# Patient Record
Sex: Female | Born: 1980 | Race: White | Hispanic: No | Marital: Married | Smoking: Never smoker
Health system: Southern US, Community
[De-identification: ages and names within clinical notes are randomized; demographics above are authoritative.]

## PROBLEM LIST (undated history)

## (undated) ENCOUNTER — Inpatient Hospital Stay (HOSPITAL_COMMUNITY): Payer: Self-pay

## (undated) ENCOUNTER — Emergency Department (HOSPITAL_COMMUNITY)

## (undated) DIAGNOSIS — R51 Headache: Secondary | ICD-10-CM

## (undated) DIAGNOSIS — R519 Headache, unspecified: Secondary | ICD-10-CM

## (undated) DIAGNOSIS — K429 Umbilical hernia without obstruction or gangrene: Secondary | ICD-10-CM

## (undated) DIAGNOSIS — F32A Depression, unspecified: Secondary | ICD-10-CM

## (undated) DIAGNOSIS — F329 Major depressive disorder, single episode, unspecified: Secondary | ICD-10-CM

## (undated) DIAGNOSIS — D62 Acute posthemorrhagic anemia: Secondary | ICD-10-CM

## (undated) HISTORY — PX: WISDOM TOOTH EXTRACTION: SHX21

## (undated) HISTORY — PX: MOUTH SURGERY: SHX715

## (undated) HISTORY — PX: ABDOMINAL HYSTERECTOMY: SHX81

## (undated) HISTORY — PX: TONSILLECTOMY: SUR1361

## (undated) SURGERY — Surgical Case
Anesthesia: *Unknown

---

## 2010-09-29 LAB — RPR: RPR: NONREACTIVE

## 2010-09-29 LAB — HEPATITIS B SURFACE ANTIGEN: Hepatitis B Surface Ag: NEGATIVE

## 2010-09-29 LAB — ABO/RH

## 2010-09-29 LAB — RUBELLA ANTIBODY, IGM: Rubella: IMMUNE

## 2011-04-15 ENCOUNTER — Other Ambulatory Visit: Payer: Self-pay | Admitting: Obstetrics and Gynecology

## 2011-04-15 ENCOUNTER — Encounter (HOSPITAL_COMMUNITY): Payer: Self-pay | Admitting: Pharmacist

## 2011-04-28 ENCOUNTER — Encounter (HOSPITAL_COMMUNITY): Payer: Self-pay

## 2011-04-29 ENCOUNTER — Encounter (HOSPITAL_COMMUNITY)
Admission: RE | Admit: 2011-04-29 | Discharge: 2011-04-29 | Disposition: A | Discharge status: Home / Self Care | Payer: 59 | Source: Ambulatory Visit | Attending: Obstetrics and Gynecology | Admitting: Obstetrics and Gynecology

## 2011-04-29 ENCOUNTER — Encounter (HOSPITAL_COMMUNITY): Payer: Self-pay

## 2011-04-29 LAB — CBC
MCH: 31 pg (ref 26.0–34.0)
MCV: 93.4 fL (ref 78.0–100.0)
Platelets: 151 10*3/uL (ref 150–400)
RBC: 3.93 MIL/uL (ref 3.87–5.11)
RDW: 14.3 % (ref 11.5–15.5)

## 2011-04-29 LAB — SURGICAL PCR SCREEN: MRSA, PCR: NEGATIVE

## 2011-04-29 NOTE — Patient Instructions (Addendum)
21 Alexandria Carr  04/29/2011   Your procedure is scheduled on:  05/07/11  Enter through the Main Entrance of [facility] at 945 AM.  Pick up the phone at the desk and dial [phone].   Call this number if you have problems the morning of surgery: [phone]   Remember:   Do not eat food:After Midnight.  Do not drink clear liquids: After Midnight.  Take these medicines the morning of surgery with A SIP OF WATER: NA   Do not wear jewelry, make-up or nail polish.  Do not wear lotions, powders, or perfumes. You may wear deodorant.  Do not shave 48 hours prior to surgery.  Do not bring valuables to the hospital.  Contacts, dentures or bridgework may not be worn into surgery.  Leave suitcase in the car. After surgery it may be brought to your room.  For patients admitted to the hospital, checkout time is 11:00 AM the day of discharge.   Patients discharged the day of surgery will not be allowed to drive home.  Name and phone number of your driver: NA  Special Instructions: CHG Shower Use Special Wash: 1/2 bottle night before surgery and 1/2 bottle morning of surgery.   Please read over the following fact sheets that you were given: MRSA Information

## 2011-05-07 ENCOUNTER — Encounter (HOSPITAL_COMMUNITY)
Admission: AD | Disposition: A | Discharge status: Home / Self Care | Payer: Self-pay | Source: Ambulatory Visit | Attending: Obstetrics and Gynecology

## 2011-05-07 ENCOUNTER — Inpatient Hospital Stay (HOSPITAL_COMMUNITY): Payer: 59

## 2011-05-07 ENCOUNTER — Inpatient Hospital Stay (HOSPITAL_COMMUNITY)
Admission: AD | Admit: 2011-05-07 | Discharge: 2011-05-09 | DRG: 766 | Disposition: A | Discharge status: Home / Self Care | Payer: 59 | Source: Ambulatory Visit | Attending: Obstetrics and Gynecology | Admitting: Obstetrics and Gynecology

## 2011-05-07 ENCOUNTER — Encounter (HOSPITAL_COMMUNITY): Payer: Self-pay | Admitting: Anesthesiology

## 2011-05-07 ENCOUNTER — Encounter (HOSPITAL_COMMUNITY): Payer: Self-pay | Admitting: *Deleted

## 2011-05-07 ENCOUNTER — Encounter (HOSPITAL_COMMUNITY): Payer: Self-pay

## 2011-05-07 DIAGNOSIS — Z01812 Encounter for preprocedural laboratory examination: Secondary | ICD-10-CM

## 2011-05-07 DIAGNOSIS — O34219 Maternal care for unspecified type scar from previous cesarean delivery: Principal | ICD-10-CM | POA: Diagnosis present

## 2011-05-07 DIAGNOSIS — Z01818 Encounter for other preprocedural examination: Secondary | ICD-10-CM

## 2011-05-07 SURGERY — Surgical Case
Anesthesia: Spinal | Site: Abdomen | Wound class: Clean Contaminated

## 2011-05-07 MED ORDER — LANOLIN HYDROUS EX OINT: 1.0000 "application " | TOPICAL_OINTMENT | CUTANEOUS | Status: DC | PRN

## 2011-05-07 MED ORDER — MENTHOL 3 MG MT LOZG: 1.0000 | LOZENGE | OROMUCOSAL | Status: DC | PRN

## 2011-05-07 MED ORDER — BUPIVACAINE IN DEXTROSE 0.75-8.25 % IT SOLN
INTRATHECAL | Status: DC | PRN
  Administered 2011-05-07: 11.75 mg via INTRATHECAL

## 2011-05-07 MED ORDER — FENTANYL CITRATE 0.05 MG/ML IJ SOLN
INTRAMUSCULAR | Status: DC | PRN
  Administered 2011-05-07: 15 ug via INTRATHECAL

## 2011-05-07 MED ORDER — CEFAZOLIN SODIUM 1-5 GM-% IV SOLN
INTRAVENOUS | Status: AC
  Administered 2011-05-07: 1 g via INTRAVENOUS
  Filled 2011-05-07: qty 50

## 2011-05-07 MED ORDER — SCOPOLAMINE 1 MG/3DAYS TD PT72
1.0000 | MEDICATED_PATCH | Freq: Once | TRANSDERMAL | Status: DC
Start: 2011-05-07 — End: 2011-05-07
  Administered 2011-05-07: 1.5 mg via TRANSDERMAL

## 2011-05-07 MED ORDER — PRENATAL MULTIVITAMIN CH
1.0000 | ORAL_TABLET | Freq: Every day | ORAL | Status: DC
  Administered 2011-05-08 – 2011-05-09 (×2): 1 via ORAL
  Filled 2011-05-07 (×2): qty 1

## 2011-05-07 MED ORDER — KETOROLAC TROMETHAMINE 30 MG/ML IJ SOLN: 30.0000 mg | Freq: Four times a day (QID) | INTRAMUSCULAR | Status: AC | PRN

## 2011-05-07 MED ORDER — OXYTOCIN 20 UNITS IN LACTATED RINGERS INFUSION - SIMPLE: 125.0000 mL/h | INTRAVENOUS | Status: AC

## 2011-05-07 MED ORDER — ONDANSETRON HCL 4 MG/2ML IJ SOLN
INTRAMUSCULAR | Status: AC
  Filled 2011-05-07: qty 2

## 2011-05-07 MED ORDER — PHENYLEPHRINE HCL 10 MG/ML IJ SOLN
INTRAMUSCULAR | Status: DC | PRN
  Administered 2011-05-07 (×2): 80 ug via INTRAVENOUS

## 2011-05-07 MED ORDER — FENTANYL CITRATE 0.05 MG/ML IJ SOLN
INTRAMUSCULAR | Status: AC
  Filled 2011-05-07: qty 2

## 2011-05-07 MED ORDER — MEPERIDINE HCL 25 MG/ML IJ SOLN: 6.2500 mg | INTRAMUSCULAR | Status: DC | PRN

## 2011-05-07 MED ORDER — LACTATED RINGERS IV SOLN
INTRAVENOUS | Status: DC
  Administered 2011-05-07 (×4): via INTRAVENOUS

## 2011-05-07 MED ORDER — METOCLOPRAMIDE HCL 5 MG/ML IJ SOLN: 10.0000 mg | Freq: Three times a day (TID) | INTRAMUSCULAR | Status: DC | PRN

## 2011-05-07 MED ORDER — MORPHINE SULFATE 0.5 MG/ML IJ SOLN
INTRAMUSCULAR | Status: AC
  Filled 2011-05-07: qty 10

## 2011-05-07 MED ORDER — OXYTOCIN 20 UNITS IN LACTATED RINGERS INFUSION - SIMPLE
INTRAVENOUS | Status: DC | PRN
  Administered 2011-05-07: 20 [IU] via INTRAVENOUS

## 2011-05-07 MED ORDER — DIPHENHYDRAMINE HCL 50 MG/ML IJ SOLN: 25.0000 mg | INTRAMUSCULAR | Status: DC | PRN

## 2011-05-07 MED ORDER — DIBUCAINE 1 % RE OINT: 1.0000 "application " | TOPICAL_OINTMENT | RECTAL | Status: DC | PRN

## 2011-05-07 MED ORDER — CEFAZOLIN SODIUM 1-5 GM-% IV SOLN: 1.0000 g | INTRAVENOUS | Status: DC

## 2011-05-07 MED ORDER — WITCH HAZEL-GLYCERIN EX PADS: 1.0000 "application " | MEDICATED_PAD | CUTANEOUS | Status: DC | PRN

## 2011-05-07 MED ORDER — MIDAZOLAM HCL 2 MG/2ML IJ SOLN: 0.5000 mg | Freq: Once | INTRAMUSCULAR | Status: DC | PRN

## 2011-05-07 MED ORDER — NALBUPHINE SYRINGE 5 MG/0.5 ML
5.0000 mg | INJECTION | INTRAMUSCULAR | Status: DC | PRN
  Filled 2011-05-07: qty 1

## 2011-05-07 MED ORDER — EPHEDRINE 5 MG/ML INJ
INTRAVENOUS | Status: AC
  Filled 2011-05-07: qty 10

## 2011-05-07 MED ORDER — SENNOSIDES-DOCUSATE SODIUM 8.6-50 MG PO TABS
2.0000 | ORAL_TABLET | Freq: Every day | ORAL | Status: DC
  Administered 2011-05-07 – 2011-05-08 (×2): 2 via ORAL

## 2011-05-07 MED ORDER — OXYCODONE-ACETAMINOPHEN 5-325 MG PO TABS
1.0000 | ORAL_TABLET | ORAL | Status: DC | PRN
  Administered 2011-05-08 (×3): 1 via ORAL
  Administered 2011-05-08: 2 via ORAL
  Administered 2011-05-08 (×3): 1 via ORAL
  Administered 2011-05-09: 2 via ORAL
  Filled 2011-05-07 (×2): qty 1
  Filled 2011-05-07: qty 2
  Filled 2011-05-07 (×2): qty 1
  Filled 2011-05-07: qty 2
  Filled 2011-05-07 (×2): qty 1

## 2011-05-07 MED ORDER — ONDANSETRON HCL 4 MG PO TABS: 4.0000 mg | ORAL_TABLET | ORAL | Status: DC | PRN

## 2011-05-07 MED ORDER — OXYTOCIN 20 UNITS IN LACTATED RINGERS INFUSION - SIMPLE
INTRAVENOUS | Status: AC
  Administered 2011-05-07: 20 [IU]
  Filled 2011-05-07: qty 1000

## 2011-05-07 MED ORDER — IBUPROFEN 600 MG PO TABS
600.0000 mg | ORAL_TABLET | Freq: Four times a day (QID) | ORAL | Status: DC
  Administered 2011-05-07 – 2011-05-09 (×8): 600 mg via ORAL
  Filled 2011-05-07 (×8): qty 1

## 2011-05-07 MED ORDER — ACETAMINOPHEN 10 MG/ML IV SOLN
1000.0000 mg | Freq: Four times a day (QID) | INTRAVENOUS | Status: AC | PRN
  Filled 2011-05-07: qty 100

## 2011-05-07 MED ORDER — TETANUS-DIPHTH-ACELL PERTUSSIS 5-2.5-18.5 LF-MCG/0.5 IM SUSP: 0.5000 mL | Freq: Once | INTRAMUSCULAR | Status: DC

## 2011-05-07 MED ORDER — ONDANSETRON HCL 4 MG/2ML IJ SOLN: 4.0000 mg | Freq: Three times a day (TID) | INTRAMUSCULAR | Status: DC | PRN

## 2011-05-07 MED ORDER — KETOROLAC TROMETHAMINE 30 MG/ML IJ SOLN
30.0000 mg | Freq: Four times a day (QID) | INTRAMUSCULAR | Status: AC | PRN
  Administered 2011-05-07: 30 mg via INTRAMUSCULAR

## 2011-05-07 MED ORDER — SODIUM CHLORIDE 0.9 % IV SOLN
1.0000 ug/kg/h | INTRAVENOUS | Status: DC | PRN
  Filled 2011-05-07: qty 2.5

## 2011-05-07 MED ORDER — DIPHENHYDRAMINE HCL 25 MG PO CAPS
25.0000 mg | ORAL_CAPSULE | ORAL | Status: DC | PRN
  Administered 2011-05-07 – 2011-05-08 (×2): 25 mg via ORAL
  Filled 2011-05-07 (×3): qty 1

## 2011-05-07 MED ORDER — FENTANYL CITRATE 0.05 MG/ML IJ SOLN
25.0000 ug | INTRAMUSCULAR | Status: DC | PRN
  Administered 2011-05-07: 50 ug via INTRAVENOUS

## 2011-05-07 MED ORDER — SIMETHICONE 80 MG PO CHEW
80.0000 mg | CHEWABLE_TABLET | ORAL | Status: DC | PRN
  Administered 2011-05-07: 80 mg via ORAL

## 2011-05-07 MED ORDER — ACETAMINOPHEN 325 MG PO TABS: 325.0000 mg | ORAL_TABLET | ORAL | Status: DC | PRN

## 2011-05-07 MED ORDER — SODIUM CHLORIDE 0.9 % IJ SOLN: 3.0000 mL | INTRAMUSCULAR | Status: DC | PRN

## 2011-05-07 MED ORDER — ZOLPIDEM TARTRATE 5 MG PO TABS: 5.0000 mg | ORAL_TABLET | Freq: Every evening | ORAL | Status: DC | PRN

## 2011-05-07 MED ORDER — ONDANSETRON HCL 4 MG/2ML IJ SOLN: 4.0000 mg | INTRAMUSCULAR | Status: DC | PRN

## 2011-05-07 MED ORDER — LACTATED RINGERS IV SOLN
INTRAVENOUS | Status: DC
  Administered 2011-05-07: 19:00:00 via INTRAVENOUS

## 2011-05-07 MED ORDER — KETOROLAC TROMETHAMINE 30 MG/ML IJ SOLN
INTRAMUSCULAR | Status: AC
  Filled 2011-05-07: qty 1

## 2011-05-07 MED ORDER — SIMETHICONE 80 MG PO CHEW
80.0000 mg | CHEWABLE_TABLET | Freq: Three times a day (TID) | ORAL | Status: DC
  Administered 2011-05-07 – 2011-05-09 (×6): 80 mg via ORAL

## 2011-05-07 MED ORDER — SCOPOLAMINE 1 MG/3DAYS TD PT72: 1.0000 | MEDICATED_PATCH | Freq: Once | TRANSDERMAL | Status: DC

## 2011-05-07 MED ORDER — OXYTOCIN 10 UNIT/ML IJ SOLN
INTRAMUSCULAR | Status: AC
  Filled 2011-05-07: qty 2

## 2011-05-07 MED ORDER — MORPHINE SULFATE (PF) 0.5 MG/ML IJ SOLN
INTRAMUSCULAR | Status: DC | PRN
  Administered 2011-05-07: .1 mg via INTRATHECAL

## 2011-05-07 MED ORDER — PHENYLEPHRINE 40 MCG/ML (10ML) SYRINGE FOR IV PUSH (FOR BLOOD PRESSURE SUPPORT)
PREFILLED_SYRINGE | INTRAVENOUS | Status: AC
  Filled 2011-05-07: qty 5

## 2011-05-07 MED ORDER — DIPHENHYDRAMINE HCL 25 MG PO CAPS
25.0000 mg | ORAL_CAPSULE | Freq: Four times a day (QID) | ORAL | Status: DC | PRN
  Administered 2011-05-08: 25 mg via ORAL
  Filled 2011-05-07: qty 1

## 2011-05-07 MED ORDER — DIPHENHYDRAMINE HCL 50 MG/ML IJ SOLN: 12.5000 mg | INTRAMUSCULAR | Status: DC | PRN

## 2011-05-07 MED ORDER — NALOXONE HCL 0.4 MG/ML IJ SOLN: 0.4000 mg | INTRAMUSCULAR | Status: DC | PRN

## 2011-05-07 MED ORDER — ONDANSETRON HCL 4 MG/2ML IJ SOLN
INTRAMUSCULAR | Status: DC | PRN
  Administered 2011-05-07: 4 mg via INTRAVENOUS

## 2011-05-07 MED ORDER — PROMETHAZINE HCL 25 MG/ML IJ SOLN: 6.2500 mg | INTRAMUSCULAR | Status: DC | PRN

## 2011-05-07 SURGICAL SUPPLY — 31 items
CHLORAPREP W/TINT 26ML (MISCELLANEOUS) ×2 IMPLANT
CLOTH BEACON ORANGE TIMEOUT ST (SAFETY) ×2 IMPLANT
DRESSING TELFA 8X3 (GAUZE/BANDAGES/DRESSINGS) ×2 IMPLANT
ELECT REM PT RETURN 9FT ADLT (ELECTROSURGICAL) ×2
ELECTRODE REM PT RTRN 9FT ADLT (ELECTROSURGICAL) ×1 IMPLANT
EXTRACTOR VACUUM M CUP 4 TUBE (SUCTIONS) IMPLANT
GAUZE SPONGE 4X4 12PLY STRL LF (GAUZE/BANDAGES/DRESSINGS) ×4 IMPLANT
GLOVE BIO SURGEON STRL SZ 6.5 (GLOVE) ×2 IMPLANT
GLOVE BIOGEL PI IND STRL 6.5 (GLOVE) ×2 IMPLANT
GLOVE BIOGEL PI INDICATOR 6.5 (GLOVE) ×2
GOWN PREVENTION PLUS LG XLONG (DISPOSABLE) ×4 IMPLANT
KIT ABG SYR 3ML LUER SLIP (SYRINGE) ×2 IMPLANT
NEEDLE HYPO 22GX1.5 SAFETY (NEEDLE) IMPLANT
NEEDLE HYPO 25X5/8 SAFETYGLIDE (NEEDLE) ×2 IMPLANT
NS IRRIG 1000ML POUR BTL (IV SOLUTION) ×2 IMPLANT
PACK C SECTION WH (CUSTOM PROCEDURE TRAY) ×2 IMPLANT
PAD ABD 7.5X8 STRL (GAUZE/BANDAGES/DRESSINGS) ×4 IMPLANT
RTRCTR C-SECT PINK 25CM LRG (MISCELLANEOUS) ×2 IMPLANT
SLEEVE SCD COMPRESS KNEE MED (MISCELLANEOUS) IMPLANT
SPONGE GAUZE 4X4 12PLY (GAUZE/BANDAGES/DRESSINGS) ×2 IMPLANT
STAPLER VISISTAT 35W (STAPLE) IMPLANT
STRIP CLOSURE SKIN 1/2X4 (GAUZE/BANDAGES/DRESSINGS) ×2 IMPLANT
SUT MON AB 2-0 CT1 27 (SUTURE) ×2 IMPLANT
SUT MON AB 4-0 PS1 27 (SUTURE) IMPLANT
SUT VIC AB 0 CTX 36 (SUTURE) ×3
SUT VIC AB 0 CTX36XBRD ANBCTRL (SUTURE) ×3 IMPLANT
SUT VIC AB 4-0 KS 27 (SUTURE) ×2 IMPLANT
TAPE CLOTH SURG 4X10 WHT LF (GAUZE/BANDAGES/DRESSINGS) ×2 IMPLANT
TOWEL OR 17X24 6PK STRL BLUE (TOWEL DISPOSABLE) ×4 IMPLANT
TRAY FOLEY CATH 14FR (SET/KITS/TRAYS/PACK) ×2 IMPLANT
WATER STERILE IRR 1000ML POUR (IV SOLUTION) IMPLANT

## 2011-05-07 NOTE — Op Note (Signed)
Preop: term IUP, desired repeat c/s Postop: same Procedure: LTCS Surgeon: Delray Alt Assist: A. Ross Anesthesia: spinal Fluids: 2800cc LR UOP: 400 cc faint pink EBL: 800cc Findings: minimal scar tissue, female, cephalic, APGAR 9/10, wt pending Specimen: cord blood Complications: none Condition: stable to PACU  dictation #: [id number]

## 2011-05-07 NOTE — Anesthesia Postprocedure Evaluation (Signed)
Anesthesia Post Note  Patient: Alexandria Carr  Procedure(s) Performed: Procedure(s) (LRB): CESAREAN SECTION (N/A)  Anesthesia type: Spinal  Patient location: PACU  Post pain: Pain level controlled  Post assessment: Post-op Vital signs reviewed  Last Vitals:  Filed Vitals:   05/07/11 1420  BP: 114/75  Pulse: 67  Temp: 36.2 C  Resp: 12    Post vital signs: Reviewed  Level of consciousness: awake  Complications: No apparent anesthesia complications

## 2011-05-07 NOTE — Transfer of Care (Signed)
Immediate Anesthesia Transfer of Care Note  Patient: Alexandria Carr  Procedure(s) Performed: Procedure(s) (LRB): CESAREAN SECTION (N/A)  Patient Location: PACU  Anesthesia Type: Spinal  Level of Consciousness: awake, alert  and oriented  Airway & Oxygen Therapy: Patient Spontanous Breathing  Post-op Assessment: Report given to PACU RN and Post -op Vital signs reviewed and stable  Post vital signs: Reviewed and stable  Complications: No apparent anesthesia complications

## 2011-05-07 NOTE — Anesthesia Preprocedure Evaluation (Signed)

## 2011-05-07 NOTE — Anesthesia Procedure Notes (Signed)
Spinal  Patient location during procedure: OR Start time: 05/07/2011 11:27 AM Staffing Performed by: anesthesiologist  Preanesthetic Checklist Completed: patient identified, site marked, surgical consent, pre-op evaluation, timeout performed, IV checked, risks and benefits discussed and monitors and equipment checked Spinal Block Patient position: sitting Prep: site prepped and draped and DuraPrep Patient monitoring: heart rate, cardiac monitor, continuous pulse ox and blood pressure Approach: midline Location: L3-4 Injection technique: single-shot Needle Needle type: Sprotte  Needle gauge: 24 G Needle length: 9 cm Assessment Sensory level: T4 Additional Notes Clear free flow CSF on first attempt.  No paresthesia.  Patient tolerated procedure well.  Jasmine December, MD

## 2011-05-07 NOTE — Consult Note (Signed)
Called to attend repeat C/S at term gestation.No reported risk factors.  Manually extracted form vertex presentation, Given bulb suction to naso/oropharynx yielding minimal fluid and vigorous tactile stimulation. No dysmorphic features.    Shown to parents and care transferred to [facility] and to assigned pediatrician.   Dagoberto Ligas MD [facility]

## 2011-05-07 NOTE — H&P (Signed)
31 y.o. presents for scheduled repeat c/s. No bleeding, LOF, good FM.  No other complaints.  Past Medical History  Diagnosis Date  . Asthma     no episodes over 61yrs/no inhaler   Past Surgical History  Procedure Date  . Cesarean section 2006  . Mouth surgery   . Tonsillectomy     age 87yo    History   Social History  . Marital Status: Married    Spouse Name: N/A    Number of Children: N/A  . Years of Education: N/A   Occupational History  . Not on file.   Social History Main Topics  . Smoking status: Former Games developer  . Smokeless tobacco: Not on file  . Alcohol Use: No  . Drug Use: No  . Sexually Active:    Other Topics Concern  . Not on file   Social History Narrative  . No narrative on file    No current facility-administered medications on file prior to encounter.   No current outpatient prescriptions on file prior to encounter.    Allergies  Allergen Reactions  . Codeine Itching  . Penicillins Itching    Pt. Tolerates Amoxicillin ok.    @VITALS2 @  Lungs: clear to ascultation Cor:  RRR Abdomen:  soft, nontender, nondistended. Ex:  no cords, erythema Pelvic:  def  A:  Admit to L&D for scheduled c/s   P:    All risks, benefits and alternatives d/w patient. Pre-op abx Routine care.   Philip Aspen

## 2011-05-08 LAB — CBC
MCH: 31.1 pg (ref 26.0–34.0)
MCHC: 33 g/dL (ref 30.0–36.0)
MCV: 94.2 fL (ref 78.0–100.0)
Platelets: 128 10*3/uL — ABNORMAL LOW (ref 150–400)
RDW: 14.4 % (ref 11.5–15.5)

## 2011-05-08 MED ORDER — LORATADINE 10 MG PO TABS
10.0000 mg | ORAL_TABLET | Freq: Every day | ORAL | Status: DC
  Administered 2011-05-08 – 2011-05-09 (×2): 10 mg via ORAL
  Filled 2011-05-08 (×3): qty 1

## 2011-05-08 NOTE — Anesthesia Postprocedure Evaluation (Signed)
  Anesthesia Post-op Note  Patient: Alexandria Carr  Procedure(s) Performed: Procedure(s) (LRB): CESAREAN SECTION (N/A)  Patient Location: Mother/Baby  Anesthesia Type: Spinal  Level of Consciousness: awake, alert  and oriented  Airway and Oxygen Therapy: Patient Spontanous Breathing  Post-op Pain: mild  Post-op Assessment: Patient's Cardiovascular Status Stable and Respiratory Function Stable  Post-op Vital Signs: stable  Complications: No apparent anesthesia complications

## 2011-05-08 NOTE — Progress Notes (Signed)

## 2011-05-08 NOTE — Progress Notes (Addendum)
Subjective: Postpartum Day 1: Cesarean Delivery Patient reports tolerating PO and no problems voiding.  Problems with allergies. Benadryl not helpful  Objective: Vital signs in last 24 hours: Temp:  [97.2 F (36.2 C)-98.7 F (37.1 C)] 97.6 F (36.4 C) (04/13 0730) Pulse Rate:  [62-90] 71  (04/13 0730) Resp:  [11-31] 12  (04/13 0730) BP: (94-131)/(53-75) 107/69 mmHg (04/13 0730) SpO2:  [96 %-100 %] 99 % (04/13 0730) Weight:  [68.04 kg (150 lb)-70.761 kg (156 lb)] 68.04 kg (150 lb) (04/13 0411)  Physical Exam:  General: alert, cooperative and appears stated age 30: appropriate Uterine Fundus: firm Incision: healing well, no significant drainage, no dehiscence, no significant erythema DVT Evaluation: No evidence of DVT seen on physical exam.   Basename 05/08/11 0530  HGB 10.7*  HCT 32.4*    Assessment/Plan: Status post Cesarean section. Doing well postoperatively.  Continue current care. Claritin for allergies Neonatal circ desired. R/B/A reviewed. Will do tomorrow  Kinzie Wickes H. 05/08/2011, 10:49 AM

## 2011-05-08 NOTE — Op Note (Signed)
NAMESHANICQUA, COLDREN NO.:  [id number]  MEDICAL RECORD NO.:  [id number]  LOCATION:  [facility]                          FACILITY:  [facility]  PHYSICIAN:  Philip Aspen, DO    DATE OF BIRTH:  [birth year]  DATE OF PROCEDURE:  05/07/2011 DATE OF DISCHARGE:                              OPERATIVE REPORT   __________.  PROCEDURE PERFORMED:  Low-transverse cesarean section __________  DESCRIPTION OF PROCEDURE:  The patient was taken to the operating room __________  hemostatic.  The fascia was reapproximated and closed with looped PDS in running fashion.  in a running fashion. Irrigation of the subcutaneous tissue was performed and minimal use of Bovie cautery __________.  The skin was then reapproximated with 3-0 Vicryl in subcuticular fashion. __________. Sponge, lap, and needle counts were correct x2.  The patient was taken to recovery in stable condition.__________          ______________________________ Philip Aspen, DO     [employer]/[facility]  D:  05/07/2011  T:  05/07/2011  Job:  [id number]

## 2011-05-08 NOTE — Addendum Note (Signed)
Addendum  created 05/08/11 1513 by Earmon Phoenix, CRNA   Modules edited:Notes Section

## 2011-05-09 MED ORDER — OXYCODONE-ACETAMINOPHEN 5-325 MG PO TABS: 2.0000 | ORAL_TABLET | ORAL | Status: AC | PRN

## 2011-05-09 MED ORDER — IBUPROFEN 600 MG PO TABS: 600.0000 mg | ORAL_TABLET | Freq: Four times a day (QID) | ORAL | Status: AC | PRN

## 2011-05-09 MED ORDER — DOCUSATE SODIUM 100 MG PO CAPS: 100.0000 mg | ORAL_CAPSULE | Freq: Two times a day (BID) | ORAL | Status: AC

## 2011-05-09 NOTE — Progress Notes (Signed)
Reviewed D/C instructions with pt and FOB.  Answered questions.  Copy of instructions given to pt. Make appt with Dr Mechele Collin office in 4 weeks.

## 2011-05-09 NOTE — Discharge Summary (Signed)
Obstetric Discharge Summary Reason for Admission: Scheduled cesarean section Prenatal Procedures: NST and ultrasound Intrapartum Procedures: cesarean: low cervical, transverse Postpartum Procedures: none Complications-Operative and Postpartum: none Hemoglobin  Date Value Range Status  05/08/2011 10.7* 12.0-15.0 (g/dL) Final     HCT  Date Value Range Status  05/08/2011 32.4* 36.0-46.0 (%) Final    Physical Exam:  General: alert, cooperative and appears stated age 31: appropriate Uterine Fundus: firm Incision: healing well  Discharge Diagnoses: Term Pregnancy-delivered  Discharge Information: Date: 05/09/2011 Activity: pelvic rest Diet: routine Medications: Ibuprofen, Colace and Percocet Condition: stable Instructions: refer to practice specific booklet Discharge to: home Follow-up Information    Follow up with CALLAHAN, SIDNEY, DO in 4 weeks. (For a postpartum evaluation)    Contact information:   [address] [facility] [place] [address] [phone]          Newborn Data: Live born female  Birth Weight: 9 lb 4.9 oz (4220 g) APGAR: 9, 10  Home with mother.  Trace Cederberg H. 05/09/2011, 11:01 AM

## 2011-05-10 ENCOUNTER — Encounter (HOSPITAL_COMMUNITY): Payer: Self-pay | Admitting: Obstetrics and Gynecology

## 2011-05-19 ENCOUNTER — Ambulatory Visit (HOSPITAL_COMMUNITY)
Admission: RE | Admit: 2011-05-19 | Discharge: 2011-05-19 | Disposition: A | Discharge status: Home / Self Care | Payer: 59 | Source: Ambulatory Visit | Attending: Obstetrics and Gynecology | Admitting: Obstetrics and Gynecology

## 2011-05-19 NOTE — Progress Notes (Signed)
Adult Lactation Consultation Outpatient Visit Note  Patient Name: Alexandria Carr(mom)    Baby: Houston Okray Date of Birth: 26-Jan-1980                         DOB: [birth year] Gestational Age at Delivery: [redacted]w[redacted]d        Birth weight: 9-4.9  Discharge weight:  8-9.4 Type of Delivery: Repeat C/S                  Weight today: 8-4.8  Breastfeeding History: Frequency of Breastfeeding: 2 1/2 hours, 3-4 hours at night Length of Feeding: 10-12 minutes each breast Voids: 10-12 Stools:4+ yellow   Supplementing / Method: 1.5 oz-2 oz formula per bottle pc every feeding Pumping:  Type of Pump:manual/just obtained Ameda Purely Yours   Frequency:every 3 + hours  Volume:  3 mls right breast/20 mls left  Comments:    Consultation Evaluation: Mom here with 32 day old baby with hx of excessive weight loss and low milk supply.  Mom breastfed her first baby for 3.5 weeks and reports a low supply but also was under a lot of stress.  Mom does report small breast changes with pregnancy.  No hx of medical risk factors which would impact supply.    Baby's weight has been monitored closely by smart start nurse and pediatrician.  She reports baby's weight at 19 days old was 8-5, 7 days 8-4 and following day 8-3.5 at which time she was instructed to begin formula supplementation of 1.5-2oz pc every 3 hours. Breasts today are fairly soft. Observed mom positioning and latching baby well with minimal assistance needed.  Baby nursed actively on both breasts with few swallows heard.  Milk transfer after both breasts only 10 mls.  Discussed the use of a SNS at breast and mom interested in using for at least some of the supplementation during the day.  Baby latched well with SNS at breast and took 60 mls of formula over 15 minutes.  Goals reviewed with mom are 1) increase milk supply  2) baby weight gain of at least 1 oz per day  Plan:  1) breastfeed Houston with any feeding cue but at least every 3 hours 2) good breast compression an  massage during feeding to increase milk intake  3) supplement every feeding with 2-2.5 oz of expressed milk and or formula per SNS or bottle  4) Pump both breasts after feedings x 15 minutes 6 times/24 hours  Initial Feeding Assessment:20 min right breast Pre-feed [id number]:6213 Post-feed YQMVHQ:4696 Amount Transferred:2 mls Comments:  Additional Feeding Assessment:15 minutes left breast Pre-feed EXBMWU:1324 Post-feed MWNUUV:2536 Amount Transferred:8 mls Comments:  Additional Feeding Assessment: Pre-feed Weight: Post-feed Weight: Amount Transferred: Comments:  Total Breast milk Transferred this Visit: 10 mls Total Supplement Given: 60 mls formula  Additional Interventions:   Follow-Up:  Outpatient appointment Thursday 05/27/11 1030      Hansel Feinstein 05/19/2011, 1:50 PM

## 2011-05-27 ENCOUNTER — Ambulatory Visit (HOSPITAL_COMMUNITY)
Admission: RE | Admit: 2011-05-27 | Discharge: 2011-05-27 | Disposition: A | Discharge status: Home / Self Care | Payer: 59 | Source: Ambulatory Visit | Attending: Obstetrics and Gynecology | Admitting: Obstetrics and Gynecology

## 2011-05-27 NOTE — Progress Notes (Signed)
Adult Lactation Consultation Outpatient Visit Note  Patient Name: Alexandria Carr      BABY: Dayton Scrape Date of Birth: Jun 20, 1980                 DOB: [birth year] Gestational Age at Delivery:39.3    BIRTH WEIGHT: 9-4.9 Type of Delivery: C/S                      WEIGHT 05/19/11 8-4.8                                                          WEIGHT TODAY 8-8.3 Breastfeeding History: Frequency of Breastfeeding: EVERY 2.5 OZ Length of Feeding: 12-15 MINUTES EACH BREAST Voids: 8 Stools: 1 LARGE  Supplementing / Method:DONOR MILK 2 OZ EVERY 3 HOURS Pumping:  Type of Pump:AMEDA   Frequency:2-3 TIMES/24 HOURS  Volume:  FEW MLS-20MLS  Comments:    Consultation Evaluation:Mom here with 25 week old baby as follow up from 1 week ago for low milk supply and slow weight gain.  Mom states she has noted a little more fullness in breasts.  She was unable to use the SNS at home for long due to difficulty with getting it ready and placed correctly by herself.  Mom is now using all donor milk from a friend as supplement.  Observed baby latch onto both breasts deeply and nurse well.  Total milk transfer 16 mls.  Mom will continue breastfeeding on cue and increase supplementation to 3 oz for better weight gain.  Baby gained 4 oz/8 days.  Mom pleased with plan and realizes that her supply may improve some but unlikely she will obtain full supply.  Initial Feeding Assessment:Right breast 20 minutes Pre-feed RUEAVW:0981 Post-feed XBJYNW:2956 Amount Transferred:8 mls Comments:  Additional Feeding Assessment:Left breast 20 minutes Pre-feed OZHYQM:5784 Post-feed ONGEXB:2841 Amount Transferred:8 mls Comments:  Additional Feeding Assessment: Pre-feed Weight: Post-feed Weight: Amount Transferred: Comments:  Total Breast milk Transferred this Visit: 16 mls Total Supplement Given: 2.5 oz EBM  Additional Interventions:   Follow-Up will call prn      Hansel Feinstein 05/27/2011, 10:55 AM

## 2012-06-30 ENCOUNTER — Other Ambulatory Visit: Payer: Self-pay | Admitting: Obstetrics and Gynecology

## 2012-06-30 ENCOUNTER — Encounter (HOSPITAL_COMMUNITY): Payer: Self-pay

## 2012-07-03 ENCOUNTER — Encounter (HOSPITAL_COMMUNITY): Payer: Self-pay

## 2012-07-03 ENCOUNTER — Encounter (HOSPITAL_COMMUNITY)
Admission: RE | Admit: 2012-07-03 | Discharge: 2012-07-03 | Disposition: A | Discharge status: Home / Self Care | Source: Ambulatory Visit | Attending: Obstetrics and Gynecology | Admitting: Obstetrics and Gynecology

## 2012-07-03 HISTORY — DX: Depression, unspecified: F32.A

## 2012-07-03 HISTORY — DX: Major depressive disorder, single episode, unspecified: F32.9

## 2012-07-03 LAB — CBC
Hemoglobin: 11 g/dL — ABNORMAL LOW (ref 12.0–15.0)
MCH: 30.1 pg (ref 26.0–34.0)
MCHC: 33.5 g/dL (ref 30.0–36.0)
MCV: 89.9 fL (ref 78.0–100.0)
RBC: 3.65 MIL/uL — ABNORMAL LOW (ref 3.87–5.11)

## 2012-07-03 LAB — TYPE AND SCREEN: ABO/RH(D): O POS

## 2012-07-03 NOTE — Patient Instructions (Signed)
Your procedure is scheduled on:07/04/12  Enter through the Main Entrance at :10 am Pick up desk phone and dial [phone] and inform us of your arrival.  Please call [phone] if you have any problems the morning of surgery.  Remember: Do not eat or drink after midnight:tonight  ( water ok until 7am Tuesday)  You may brush your teeth the morning of surgery.  Take these meds the morning of surgery with a sip of water:none  DO NOT wear jewelry, eye make-up, lipstick,body lotion, or dark fingernail polish.  (Polished toes are ok)   If you are to be admitted after surgery, leave suitcase in car until your room has been assigned. Patients discharged on the day of surgery will not be allowed to drive home. Wear loose fitting, comfortable clothes for your ride home.

## 2012-07-03 NOTE — Pre-Procedure Instructions (Signed)
OB prenatal records are from 2012 pregnancy

## 2012-07-04 ENCOUNTER — Inpatient Hospital Stay (HOSPITAL_COMMUNITY)
Admission: RE | Admit: 2012-07-04 | Discharge: 2012-07-06 | DRG: 766 | Disposition: A | Discharge status: Home / Self Care | Source: Ambulatory Visit | Attending: Obstetrics and Gynecology | Admitting: Obstetrics and Gynecology

## 2012-07-04 ENCOUNTER — Encounter (HOSPITAL_COMMUNITY)
Admission: RE | Disposition: A | Discharge status: Home / Self Care | Payer: Self-pay | Source: Ambulatory Visit | Attending: Obstetrics and Gynecology

## 2012-07-04 ENCOUNTER — Inpatient Hospital Stay (HOSPITAL_COMMUNITY): Admitting: Certified Registered"

## 2012-07-04 ENCOUNTER — Encounter (HOSPITAL_COMMUNITY): Payer: Self-pay | Admitting: Certified Registered"

## 2012-07-04 DIAGNOSIS — O34219 Maternal care for unspecified type scar from previous cesarean delivery: Principal | ICD-10-CM | POA: Diagnosis present

## 2012-07-04 SURGERY — Surgical Case
Anesthesia: Regional | Site: Abdomen | Wound class: Clean Contaminated

## 2012-07-04 MED ORDER — CEFAZOLIN SODIUM-DEXTROSE 2-3 GM-% IV SOLR
2.0000 g | Freq: Once | INTRAVENOUS | Status: AC
  Administered 2012-07-04: 2 g via INTRAVENOUS
  Filled 2012-07-04: qty 50

## 2012-07-04 MED ORDER — MEPERIDINE HCL 25 MG/ML IJ SOLN: 6.2500 mg | INTRAMUSCULAR | Status: DC | PRN

## 2012-07-04 MED ORDER — KETOROLAC TROMETHAMINE 60 MG/2ML IM SOLN
60.0000 mg | Freq: Once | INTRAMUSCULAR | Status: AC | PRN
  Filled 2012-07-04: qty 2

## 2012-07-04 MED ORDER — MORPHINE SULFATE 0.5 MG/ML IJ SOLN
INTRAMUSCULAR | Status: AC
  Filled 2012-07-04: qty 10

## 2012-07-04 MED ORDER — LANOLIN HYDROUS EX OINT: 1.0000 "application " | TOPICAL_OINTMENT | CUTANEOUS | Status: DC | PRN

## 2012-07-04 MED ORDER — ACETAMINOPHEN 10 MG/ML IV SOLN
1000.0000 mg | Freq: Four times a day (QID) | INTRAVENOUS | Status: DC | PRN
  Filled 2012-07-04: qty 100

## 2012-07-04 MED ORDER — ONDANSETRON HCL 4 MG PO TABS: 4.0000 mg | ORAL_TABLET | ORAL | Status: DC | PRN

## 2012-07-04 MED ORDER — ONDANSETRON HCL 4 MG/2ML IJ SOLN: 4.0000 mg | Freq: Three times a day (TID) | INTRAMUSCULAR | Status: DC | PRN

## 2012-07-04 MED ORDER — SCOPOLAMINE 1 MG/3DAYS TD PT72: 1.0000 | MEDICATED_PATCH | Freq: Once | TRANSDERMAL | Status: DC

## 2012-07-04 MED ORDER — BUPIVACAINE IN DEXTROSE 0.75-8.25 % IT SOLN
INTRATHECAL | Status: DC | PRN
  Administered 2012-07-04: 1.5 mL via INTRATHECAL

## 2012-07-04 MED ORDER — OXYTOCIN 10 UNIT/ML IJ SOLN
40.0000 [IU] | INTRAVENOUS | Status: DC | PRN
  Administered 2012-07-04: 40 [IU] via INTRAVENOUS

## 2012-07-04 MED ORDER — MORPHINE SULFATE (PF) 0.5 MG/ML IJ SOLN
INTRAMUSCULAR | Status: DC | PRN
  Administered 2012-07-04: .15 mg via INTRATHECAL

## 2012-07-04 MED ORDER — EPHEDRINE 5 MG/ML INJ
INTRAVENOUS | Status: AC
  Filled 2012-07-04: qty 10

## 2012-07-04 MED ORDER — DIPHENHYDRAMINE HCL 50 MG/ML IJ SOLN
12.5000 mg | INTRAMUSCULAR | Status: DC | PRN
  Administered 2012-07-04: 12.5 mg via INTRAVENOUS

## 2012-07-04 MED ORDER — KETOROLAC TROMETHAMINE 30 MG/ML IJ SOLN
30.0000 mg | Freq: Four times a day (QID) | INTRAMUSCULAR | Status: AC | PRN
  Administered 2012-07-05 (×2): 30 mg via INTRAVENOUS
  Filled 2012-07-04 (×2): qty 1

## 2012-07-04 MED ORDER — SENNOSIDES-DOCUSATE SODIUM 8.6-50 MG PO TABS
2.0000 | ORAL_TABLET | Freq: Every day | ORAL | Status: DC
  Administered 2012-07-04 – 2012-07-05 (×2): 2 via ORAL

## 2012-07-04 MED ORDER — ONDANSETRON HCL 4 MG/2ML IJ SOLN
INTRAMUSCULAR | Status: AC
  Filled 2012-07-04: qty 2

## 2012-07-04 MED ORDER — KETOROLAC TROMETHAMINE 30 MG/ML IJ SOLN
30.0000 mg | Freq: Four times a day (QID) | INTRAMUSCULAR | Status: DC | PRN
  Administered 2012-07-04: 30 mg via INTRAVENOUS

## 2012-07-04 MED ORDER — EPHEDRINE SULFATE 50 MG/ML IJ SOLN
INTRAMUSCULAR | Status: DC | PRN
  Administered 2012-07-04: 10 mg via INTRAVENOUS

## 2012-07-04 MED ORDER — KETOROLAC TROMETHAMINE 30 MG/ML IJ SOLN
INTRAMUSCULAR | Status: AC
  Filled 2012-07-04: qty 1

## 2012-07-04 MED ORDER — NALBUPHINE SYRINGE 5 MG/0.5 ML
5.0000 mg | INJECTION | INTRAMUSCULAR | Status: DC | PRN
  Filled 2012-07-04: qty 1

## 2012-07-04 MED ORDER — LACTATED RINGERS IV SOLN
INTRAVENOUS | Status: DC | PRN
  Administered 2012-07-04: 12:00:00 via INTRAVENOUS

## 2012-07-04 MED ORDER — DIBUCAINE 1 % RE OINT: 1.0000 "application " | TOPICAL_OINTMENT | RECTAL | Status: DC | PRN

## 2012-07-04 MED ORDER — LACTATED RINGERS IV SOLN
INTRAVENOUS | Status: DC
  Administered 2012-07-04: 125 mL/h via INTRAVENOUS

## 2012-07-04 MED ORDER — NALOXONE HCL 0.4 MG/ML IJ SOLN: 0.4000 mg | INTRAMUSCULAR | Status: DC | PRN

## 2012-07-04 MED ORDER — SIMETHICONE 80 MG PO CHEW: 80.0000 mg | CHEWABLE_TABLET | ORAL | Status: DC | PRN

## 2012-07-04 MED ORDER — ZOLPIDEM TARTRATE 5 MG PO TABS: 5.0000 mg | ORAL_TABLET | Freq: Every evening | ORAL | Status: DC | PRN

## 2012-07-04 MED ORDER — TETANUS-DIPHTH-ACELL PERTUSSIS 5-2.5-18.5 LF-MCG/0.5 IM SUSP
0.5000 mL | Freq: Once | INTRAMUSCULAR | Status: AC
  Administered 2012-07-05: 0.5 mL via INTRAMUSCULAR
  Filled 2012-07-04: qty 0.5

## 2012-07-04 MED ORDER — METOCLOPRAMIDE HCL 5 MG/ML IJ SOLN: 10.0000 mg | Freq: Three times a day (TID) | INTRAMUSCULAR | Status: DC | PRN

## 2012-07-04 MED ORDER — OXYTOCIN 10 UNIT/ML IJ SOLN
INTRAMUSCULAR | Status: AC
  Filled 2012-07-04: qty 4

## 2012-07-04 MED ORDER — PHENYLEPHRINE 40 MCG/ML (10ML) SYRINGE FOR IV PUSH (FOR BLOOD PRESSURE SUPPORT)
PREFILLED_SYRINGE | INTRAVENOUS | Status: AC
  Filled 2012-07-04: qty 10

## 2012-07-04 MED ORDER — MENTHOL 3 MG MT LOZG: 1.0000 | LOZENGE | OROMUCOSAL | Status: DC | PRN

## 2012-07-04 MED ORDER — PROMETHAZINE HCL 25 MG/ML IJ SOLN: 6.2500 mg | INTRAMUSCULAR | Status: DC | PRN

## 2012-07-04 MED ORDER — FENTANYL CITRATE 0.05 MG/ML IJ SOLN
INTRAMUSCULAR | Status: AC
  Filled 2012-07-04: qty 2

## 2012-07-04 MED ORDER — SODIUM CHLORIDE 0.9 % IJ SOLN: 3.0000 mL | INTRAMUSCULAR | Status: DC | PRN

## 2012-07-04 MED ORDER — LACTATED RINGERS IV SOLN
Freq: Once | INTRAVENOUS | Status: AC
  Administered 2012-07-04: 11:00:00 via INTRAVENOUS

## 2012-07-04 MED ORDER — DIPHENHYDRAMINE HCL 50 MG/ML IJ SOLN
INTRAMUSCULAR | Status: AC
  Filled 2012-07-04: qty 1

## 2012-07-04 MED ORDER — NALOXONE HCL 1 MG/ML IJ SOLN
1.0000 ug/kg/h | INTRAVENOUS | Status: DC | PRN
  Filled 2012-07-04: qty 2

## 2012-07-04 MED ORDER — ONDANSETRON HCL 4 MG/2ML IJ SOLN
INTRAMUSCULAR | Status: DC | PRN
  Administered 2012-07-04: 4 mg via INTRAVENOUS

## 2012-07-04 MED ORDER — NALBUPHINE HCL 10 MG/ML IJ SOLN
5.0000 mg | INTRAMUSCULAR | Status: DC | PRN
  Filled 2012-07-04: qty 1

## 2012-07-04 MED ORDER — WITCH HAZEL-GLYCERIN EX PADS: 1.0000 "application " | MEDICATED_PAD | CUTANEOUS | Status: DC | PRN

## 2012-07-04 MED ORDER — DIPHENHYDRAMINE HCL 50 MG/ML IJ SOLN: 12.5000 mg | INTRAMUSCULAR | Status: DC | PRN

## 2012-07-04 MED ORDER — SIMETHICONE 80 MG PO CHEW
80.0000 mg | CHEWABLE_TABLET | Freq: Three times a day (TID) | ORAL | Status: DC
  Administered 2012-07-04 – 2012-07-06 (×8): 80 mg via ORAL

## 2012-07-04 MED ORDER — DEXTROSE 5 % IV SOLN
1.0000 ug/kg/h | INTRAVENOUS | Status: DC | PRN
  Filled 2012-07-04: qty 2

## 2012-07-04 MED ORDER — FENTANYL CITRATE 0.05 MG/ML IJ SOLN
INTRAMUSCULAR | Status: DC | PRN
  Administered 2012-07-04: 25 ug via INTRATHECAL

## 2012-07-04 MED ORDER — DIPHENHYDRAMINE HCL 50 MG/ML IJ SOLN: 25.0000 mg | INTRAMUSCULAR | Status: DC | PRN

## 2012-07-04 MED ORDER — OXYCODONE-ACETAMINOPHEN 5-325 MG PO TABS
1.0000 | ORAL_TABLET | ORAL | Status: DC | PRN
  Administered 2012-07-05 – 2012-07-06 (×3): 1 via ORAL
  Administered 2012-07-06 (×2): 2 via ORAL
  Administered 2012-07-06: 1 via ORAL
  Filled 2012-07-04 (×5): qty 1
  Filled 2012-07-04 (×2): qty 2

## 2012-07-04 MED ORDER — CEFAZOLIN SODIUM-DEXTROSE 2-3 GM-% IV SOLR
INTRAVENOUS | Status: AC
  Filled 2012-07-04: qty 50

## 2012-07-04 MED ORDER — KETOROLAC TROMETHAMINE 30 MG/ML IJ SOLN: 30.0000 mg | Freq: Four times a day (QID) | INTRAMUSCULAR | Status: DC | PRN

## 2012-07-04 MED ORDER — DIPHENHYDRAMINE HCL 25 MG PO CAPS
25.0000 mg | ORAL_CAPSULE | ORAL | Status: DC | PRN
  Filled 2012-07-04: qty 1

## 2012-07-04 MED ORDER — MIDAZOLAM HCL 2 MG/2ML IJ SOLN: 0.5000 mg | Freq: Once | INTRAMUSCULAR | Status: DC | PRN

## 2012-07-04 MED ORDER — SCOPOLAMINE 1 MG/3DAYS TD PT72
MEDICATED_PATCH | TRANSDERMAL | Status: AC
  Administered 2012-07-04: 1.5 mg via TRANSDERMAL
  Filled 2012-07-04: qty 1

## 2012-07-04 MED ORDER — DIPHENHYDRAMINE HCL 25 MG PO CAPS
25.0000 mg | ORAL_CAPSULE | ORAL | Status: DC | PRN
  Administered 2012-07-04: 25 mg via ORAL
  Filled 2012-07-04: qty 1

## 2012-07-04 MED ORDER — DIPHENHYDRAMINE HCL 25 MG PO CAPS: 25.0000 mg | ORAL_CAPSULE | Freq: Four times a day (QID) | ORAL | Status: DC | PRN

## 2012-07-04 MED ORDER — ONDANSETRON HCL 4 MG/2ML IJ SOLN: 4.0000 mg | INTRAMUSCULAR | Status: DC | PRN

## 2012-07-04 MED ORDER — FENTANYL CITRATE 0.05 MG/ML IJ SOLN: 25.0000 ug | INTRAMUSCULAR | Status: DC | PRN

## 2012-07-04 MED ORDER — PRENATAL MULTIVITAMIN CH
1.0000 | ORAL_TABLET | Freq: Every day | ORAL | Status: DC
  Administered 2012-07-05 – 2012-07-06 (×2): 1 via ORAL
  Filled 2012-07-04 (×2): qty 1

## 2012-07-04 MED ORDER — IBUPROFEN 600 MG PO TABS
600.0000 mg | ORAL_TABLET | Freq: Four times a day (QID) | ORAL | Status: DC
  Administered 2012-07-04 – 2012-07-06 (×6): 600 mg via ORAL
  Filled 2012-07-04 (×6): qty 1

## 2012-07-04 MED ORDER — LACTATED RINGERS IV SOLN
INTRAVENOUS | Status: DC
  Administered 2012-07-04 (×2): via INTRAVENOUS

## 2012-07-04 MED ORDER — KETOROLAC TROMETHAMINE 30 MG/ML IJ SOLN: 30.0000 mg | Freq: Four times a day (QID) | INTRAMUSCULAR | Status: AC | PRN

## 2012-07-04 MED ORDER — OXYTOCIN 40 UNITS IN LACTATED RINGERS INFUSION - SIMPLE MED: 62.5000 mL/h | INTRAVENOUS | Status: AC

## 2012-07-04 SURGICAL SUPPLY — 31 items
CLAMP CORD UMBIL (MISCELLANEOUS) IMPLANT
CLOTH BEACON ORANGE TIMEOUT ST (SAFETY) ×2 IMPLANT
DRAPE LG THREE QUARTER DISP (DRAPES) ×2 IMPLANT
DRSG OPSITE POSTOP 4X10 (GAUZE/BANDAGES/DRESSINGS) ×2 IMPLANT
DURAPREP 26ML APPLICATOR (WOUND CARE) ×2 IMPLANT
ELECT REM PT RETURN 9FT ADLT (ELECTROSURGICAL) ×2
ELECTRODE REM PT RTRN 9FT ADLT (ELECTROSURGICAL) ×1 IMPLANT
EXTRACTOR VACUUM M CUP 4 TUBE (SUCTIONS) IMPLANT
GAUZE SPONGE 4X4 12PLY STRL LF (GAUZE/BANDAGES/DRESSINGS) ×2 IMPLANT
GLOVE BIO SURGEON STRL SZ 6.5 (GLOVE) ×2 IMPLANT
GLOVE BIOGEL PI IND STRL 6.5 (GLOVE) ×1 IMPLANT
GLOVE BIOGEL PI INDICATOR 6.5 (GLOVE) ×1
GOWN STRL REIN XL XLG (GOWN DISPOSABLE) ×4 IMPLANT
KIT ABG SYR 3ML LUER SLIP (SYRINGE) IMPLANT
NEEDLE HYPO 25X5/8 SAFETYGLIDE (NEEDLE) IMPLANT
NS IRRIG 1000ML POUR BTL (IV SOLUTION) ×2 IMPLANT
PACK C SECTION WH (CUSTOM PROCEDURE TRAY) ×2 IMPLANT
PAD OB MATERNITY 4.3X12.25 (PERSONAL CARE ITEMS) ×2 IMPLANT
RTRCTR C-SECT PINK 25CM LRG (MISCELLANEOUS) ×2 IMPLANT
STAPLER VISISTAT 35W (STAPLE) ×2 IMPLANT
SUT MON AB 2-0 CT1 27 (SUTURE) ×2 IMPLANT
SUT MON AB 4-0 PS1 27 (SUTURE) IMPLANT
SUT PDS AB 0 CTX 60 (SUTURE) IMPLANT
SUT PLAIN 2 0 XLH (SUTURE) IMPLANT
SUT VIC AB 0 CTX 36 (SUTURE) ×4
SUT VIC AB 0 CTX36XBRD ANBCTRL (SUTURE) ×4 IMPLANT
SUT VIC AB 4-0 KS 27 (SUTURE) ×2 IMPLANT
TAPE CLOTH SURG 4X10 WHT LF (GAUZE/BANDAGES/DRESSINGS) ×2 IMPLANT
TOWEL OR 17X24 6PK STRL BLUE (TOWEL DISPOSABLE) ×6 IMPLANT
TRAY FOLEY CATH 14FR (SET/KITS/TRAYS/PACK) ×2 IMPLANT
WATER STERILE IRR 1000ML POUR (IV SOLUTION) ×2 IMPLANT

## 2012-07-04 NOTE — Brief Op Note (Signed)
07/04/2012  12:52 PM  PATIENT:  Alexandria Carr  32 y.o. female  PRE-OPERATIVE DIAGNOSIS:  REPEAT cesarean Section  POST-OPERATIVE DIAGNOSIS:  REPEAT cesarean Section  PROCEDURE:  Procedure(s): REPEAT CESAREAN SECTION (N/A)  SURGEON:  Surgeon(s) and Role:    * Philip Aspen, DO - Primary   ASSISTANTS: R Kaplan     ANESTHESIA:   epidural  EBL:  Total I/O In: 3000 [I.V.:3000] Out: 850 [Urine:350; Blood:500]  BLOOD ADMINISTERED:none  DRAINS: none   LOCAL MEDICATIONS USED:  NONE  SPECIMEN:  Source of Specimen:  cord blood  DISPOSITION OF SPECIMEN:  N/A  COUNTS:  YES  DICTATION: .Other Dictation: Dictation Number [id number]  PLAN OF CARE: Admit to inpatient   PATIENT DISPOSITION:  PACU - hemodynamically stable.   Delay start of Pharmacological VTE agent (>24hrs) due to surgical blood loss or risk of bleeding:no

## 2012-07-04 NOTE — Anesthesia Postprocedure Evaluation (Signed)
Anesthesia Post Note  Patient: Alexandria Carr  Procedure(s) Performed: Procedure(s) (LRB): REPEAT CESAREAN SECTION (N/A)  Anesthesia type: Spinal  Patient location: PACU  Post pain: Pain level controlled  Post assessment: Post-op Vital signs reviewed  Last Vitals:  Filed Vitals:   07/04/12 1315  BP: 105/70  Pulse: 64  Temp:   Resp: 25    Post vital signs: Reviewed  Level of consciousness: awake  Complications: No apparent anesthesia complications

## 2012-07-04 NOTE — Anesthesia Postprocedure Evaluation (Signed)
  Anesthesia Post-op Note  Patient: Alexandria Carr  Procedure(s) Performed: Procedure(s): REPEAT CESAREAN SECTION (N/A)  Patient Location: PACU and Mother/Baby  Anesthesia Type:Spinal  Level of Consciousness: awake, alert  and oriented  Airway and Oxygen Therapy: Patient Spontanous Breathing  Post-op Pain: mild  Post-op Assessment: Patient's Cardiovascular Status Stable, Respiratory Function Stable, No signs of Nausea or vomiting, Adequate PO intake and Pain level controlled  Post-op Vital Signs: stable  Complications: No apparent anesthesia complications

## 2012-07-04 NOTE — Anesthesia Preprocedure Evaluation (Signed)
Anesthesia Evaluation  Patient identified by MRN, date of birth, ID band Patient awake    Reviewed: Allergy & Precautions, H&P , NPO status , Patient's Chart, lab work & pertinent test results  Airway Mallampati: II      Dental no notable dental hx.    Pulmonary neg pulmonary ROS, asthma ,  breath sounds clear to auscultation  Pulmonary exam normal       Cardiovascular Exercise Tolerance: Good negative cardio ROS  Rhythm:regular Rate:Normal     Neuro/Psych negative neurological ROS  negative psych ROS   GI/Hepatic negative GI ROS, Neg liver ROS,   Endo/Other  negative endocrine ROS  Renal/GU negative Renal ROS  negative genitourinary   Musculoskeletal negative musculoskeletal ROS (+)   Abdominal Normal abdominal exam  (+)   Peds negative pediatric ROS (+)  Hematology negative hematology ROS (+)   Anesthesia Other Findings   Reproductive/Obstetrics negative OB ROS (+) Pregnancy                           Anesthesia Physical Anesthesia Plan  ASA: II  Anesthesia Plan: Spinal   Post-op Pain Management:    Induction:   Airway Management Planned:   Additional Equipment:   Intra-op Plan:   Post-operative Plan:   Informed Consent: I have reviewed the patients History and Physical, chart, labs and discussed the procedure including the risks, benefits and alternatives for the proposed anesthesia with the patient or authorized representative who has indicated his/her understanding and acceptance.     Plan Discussed with: Anesthesiologist, CRNA and Surgeon  Anesthesia Plan Comments:         Anesthesia Quick Evaluation

## 2012-07-04 NOTE — Preoperative (Signed)
Beta Blockers   Reason not to administer Beta Blockers:Not Applicable 

## 2012-07-04 NOTE — Transfer of Care (Signed)
Immediate Anesthesia Transfer of Care Note  Patient: Alexandria Carr  Procedure(s) Performed: Procedure(s): REPEAT CESAREAN SECTION (N/A)  Patient Location: PACU  Anesthesia Type:Spinal  Level of Consciousness: awake, alert  and oriented  Airway & Oxygen Therapy: Patient Spontanous Breathing and Patient connected to nasal cannula oxygen  Post-op Assessment: Report given to PACU RN and Post -op Vital signs reviewed and stable  Post vital signs: Reviewed and stable  Complications: No apparent anesthesia complications

## 2012-07-04 NOTE — Anesthesia Procedure Notes (Signed)
Spinal  Patient location during procedure: OR Start time: 07/04/2012 11:31 AM Staffing Anesthesiologist: Angus Seller., Harrell Gave. Performed by: anesthesiologist  Preanesthetic Checklist Completed: patient identified, site marked, surgical consent, pre-op evaluation, timeout performed, IV checked, risks and benefits discussed and monitors and equipment checked Spinal Block Patient position: sitting Prep: DuraPrep Patient monitoring: heart rate, cardiac monitor, continuous pulse ox and blood pressure Approach: midline Location: L3-4 Injection technique: single-shot Needle Needle type: Sprotte  Needle gauge: 24 G Needle length: 9 cm Assessment Sensory level: T4 Additional Notes Patient identified.  Risk benefits discussed including failed block, incomplete pain control, headache, nerve damage, paralysis, blood pressure changes, nausea, vomiting, reactions to medication both toxic or allergic, and postpartum back pain.  Patient expressed understanding and wished to proceed.  All questions were answered.  Sterile technique used throughout procedure.  CSF was clear.  No parasthesia or other complications.  Please see nursing notes for vital signs.

## 2012-07-04 NOTE — H&P (Signed)
31 y.o.  [id number] At 39+ weeks comes in for desired repeat cesarean section.  Otherwise has good fetal movement and no bleeding.  Past Medical History  Diagnosis Date  . Asthma     no episodes over 42yrs/no inhaler  . Depression     h/o post partum depression    Past Surgical History  Procedure Laterality Date  . Cesarean section  2006  . Mouth surgery    . Tonsillectomy      age 81yo  . Cesarean section  05/07/2011    Procedure: CESAREAN SECTION;  Surgeon: Philip Aspen, DO;  Location: [facility];  Service: Gynecology;  Laterality: N/A;    OB History   Grav Para Term Preterm Abortions TAB SAB Ect Mult Living   3 2 2       2      # Outc Date GA Lbr Len/2nd Wgt Sex Del Anes PTL Lv   1 TRM 4/13 [redacted]w[redacted]d 00:00 4.22kg(9lb4.9oz) M LTCS Spinal  Yes   Comments: no dysmorphic features   2 TRM            3 CUR               History   Social History  . Marital Status: Married    Spouse Name: N/A    Number of Children: N/A  . Years of Education: N/A   Occupational History  . Not on file.   Social History Main Topics  . Smoking status: Former Games developer  . Smokeless tobacco: Not on file  . Alcohol Use: No  . Drug Use: No  . Sexually Active: Not on file   Other Topics Concern  . Not on file   Social History Narrative  . No narrative on file   Codeine and Penicillins    Prenatal Transfer Tool  Maternal Diabetes: No Genetic Screening: Declined Maternal Ultrasounds/Referrals: Normal Fetal Ultrasounds or other Referrals:  None Maternal Substance Abuse:  No Significant Maternal Medications:  None Significant Maternal Lab Results: GBS Neg  Other PNC: uncomplicated    Filed Vitals:   07/04/12 1010  BP: 112/65  Pulse: 78  Temp: 98.2 F (36.8 C)  Resp: 16     Lungs/Cor:  NAD Abdomen:  soft, gravid Ex:  no cords, erythema SVE:  def FHTs:  Def to OR, good STV, NST R Toco:  n/a   A/P   Admit for desired repeat c/s x 3 Pt's husband with strep throat, requesting rapid  throat culture - ordered Other routine care  GBS neg  Kanton Kamel

## 2012-07-05 ENCOUNTER — Encounter (HOSPITAL_COMMUNITY): Payer: Self-pay | Admitting: Obstetrics and Gynecology

## 2012-07-05 LAB — CBC
MCHC: 34.2 g/dL (ref 30.0–36.0)
MCV: 88.8 fL (ref 78.0–100.0)
Platelets: 148 10*3/uL — ABNORMAL LOW (ref 150–400)
RDW: 13.8 % (ref 11.5–15.5)
WBC: 10 10*3/uL (ref 4.0–10.5)

## 2012-07-05 NOTE — Progress Notes (Signed)
POD#1 Pt without complaints. Lochia -mild VSSAF IMP/ stable PLAN/ routine care/ 

## 2012-07-06 LAB — CULTURE, GROUP A STREP

## 2012-07-06 MED ORDER — OXYCODONE-ACETAMINOPHEN 5-325 MG PO TABS: 1.0000 | ORAL_TABLET | ORAL | Status: DC | PRN

## 2012-07-06 NOTE — Progress Notes (Signed)
  Patient is eating, ambulating, voiding.  Pain control is good.  Filed Vitals:   07/05/12 0815 07/05/12 1115 07/05/12 1748 07/06/12 0514  BP: 94/56 107/66 101/54 110/70  Pulse: 59 60 65 76  Temp: 98.6 F (37 C) 97.9 F (36.6 C) 98.6 F (37 C) 98.1 F (36.7 C)  TempSrc: Oral Oral Oral Oral  Resp: 16 16 18 17   Weight:      SpO2: 97% 97%  97%    lungs:   clear to auscultation cor:    RRR Abdomen:  soft, appropriate tenderness, incisions intact and without erythema or exudate ex:    no cords   Lab Results  Component Value Date   WBC 10.0 07/05/2012   HGB 9.5* 07/05/2012   HCT 27.8* 07/05/2012   MCV 88.8 07/05/2012   PLT 148* 07/05/2012    --/--/O POS (06/09 1220)/RI  A/P    Post operative day 2.  Routine post op and postpartum care.  Expect d/c today.  Percocet for pain control.

## 2012-07-06 NOTE — Lactation Note (Addendum)
This note was copied from the chart of Boy Felisha Pickford. Lactation Consultation Note  Patient Name: Boy Takerra Lupinacci [id number] Date: 07/06/2012 Reason for consult: Follow-up assessment   Maternal Data    Feeding Feeding Type: Breast Milk Feeding method: Breast Length of feed: 25 min  LATCH Score/Interventions Latch: Grasps breast easily, tongue down, lips flanged, rhythmical sucking.  Audible Swallowing: Spontaneous and intermittent  Type of Nipple: Everted at rest and after stimulation  Comfort (Breast/Nipple): Filling, red/small blisters or bruises, mild/mod discomfort  Problem noted: Mild/Moderate discomfort;Filling  Hold (Positioning): Assistance needed to correctly position infant at breast and maintain latch. Intervention(s): Breastfeeding basics reviewed  LATCH Score: 8  Lactation Tools Discussed/Used     Consult Status Consult Status: Complete  Assisted mom with latch. Breasts are feeling much fuller this morning. Reports that with last baby she never had good milk supply and that baby lost over 1 lb from BW. Had several OP appointments with [place]. Reports that her breasts have never felt this full. Baby latched well  after I untucked his bottom lip and nursed for 25 minutes with lots of swallows noted. Mom reports that breast feels softer. Baby asleep in mom's lap. Continues with comfort gels. No questions at present. To call prn Does not want to make OP appointment at this time. To see Ped on Monday.   Pamelia Hoit 07/06/2012, 9:50 AM

## 2012-07-06 NOTE — Progress Notes (Signed)
Pt discharged before CSW could assess history of depression.      

## 2012-07-06 NOTE — Discharge Summary (Signed)
Obstetric Discharge Summary Reason for Admission: cesarean section Prenatal Procedures: none Intrapartum Procedures: cesarean: low cervical, transverse Postpartum Procedures: none Complications-Operative and Postpartum: none Hemoglobin  Date Value Range Status  07/05/2012 9.5* 12.0 - 15.0 g/dL Final     HCT  Date Value Range Status  07/05/2012 27.8* 36.0 - 46.0 % Final   Discharge Diagnoses: Term Pregnancy-delivered  Discharge Information: Date: 07/06/2012 Activity: pelvic rest Diet: routine Medications: Percocet Condition: stable Instructions: refer to practice specific booklet Discharge to: home Follow-up Information   Follow up with CALLAHAN, SIDNEY, DO In 4 weeks.   Contact information:   [address] [place] [address] [phone]       Newborn Data: Live born female  Birth Weight: 8 lb 15.2 oz (4060 g) APGAR: 9, 9  Home with mother.  Janaya Broy A 07/06/2012, 7:51 AM

## 2012-07-28 NOTE — Op Note (Signed)
NAMEAYRABELLA, LABOMBARD NO.:  [id number]  MEDICAL RECORD NO.:  [id number]  LOCATION:  9104                          FACILITY:  [facility]  PHYSICIAN:  Philip Aspen, DO    DATE OF BIRTH:  1980-03-28  DATE OF PROCEDURE:  07/04/2012 DATE OF DISCHARGE:  07/06/2012                              OPERATIVE REPORT   PREOPERATIVE DIAGNOSIS:  Repeat cesarean section.  POSTOPERATIVE DIAGNOSIS:  Repeat cesarean section.  PROCEDURE:  Low-transverse cesarean section.  SURGEON:  Philip Aspen, D.O.  ASSISTANT:  Ilda Mori, M.D.  ANESTHESIA:  Spinal.  ESTIMATED BLOOD LOSS:  500 mL.  URINE OUTPUT:  350 mL.  FLUIDS:  2000 mL.  SPECIMENS:  Cord blood.  FINDINGS:  Female infant in cephalic presentation with Apgars of 9 and 9 and a weight of 8 pounds 15 ounces, otherwise normal tubes and ovaries.  COMPLICATIONS:  None.  CONDITION:  Stable to PACU.  DESCRIPTION OF THE PROCEDURE:  The patient was taken to the operating room after consent was obtained.  She was prepped and draped in the normal sterile fashion in the dorsal supine position with a leftward tilt.  A Pfannenstiel skin incision was made and carried down to the underlying layer of fascia with the Bovie cautery.  The fascia was incised in midline with a scalpel and extended laterally with Mayo scissors.  Kocher clamps were placed at the superior aspect of the fascial incision and rectus muscles were dissected off bluntly and sharply.  Kocher clamps were then placed at the inferior aspect of the fascial incision and again the tissues tented and the rectus muscles were dissected off bluntly and sharply.  Kocher clamps were removed and hemostat was used to separate the rectus muscles with good visualization of bladder.  The peritoneum was entered sharply and extended laterally by manual traction and Alexis self retractor was placed after manual survey of the uterus and upper abdomen.  Bladder flap was created  by sharp and blunt dissection and the scalp was used to create the low- transverse uterine incision.  The incision was extended by caudal and cephalic traction and the infant's head was elevated from the pelvis and delivered through the uterine incision without difficulty followed by the body.  The cord was clamped and cut and the infant was bulb suctioned and handed off to the awaiting neonatology.  Pitocin was started.  Cord blood was collected and the placenta was delivered by external massage on the uterus.  The uterus was cleared of all clot and debris and the uterine incision was closed with two layers of Vicryl, the first layer running locked and the second layer of Lembert horizontal imbrication.  The incision was found to be hemostatic.  Blood was cleared from both gutters with good visualization of the ovaries and tubes.  The Alexis self-retaining retractor was removed and then bladder blade was placed for final survey of the uterine incision as well as the fascia, rectus muscles, and subcutaneous tissue.  All was hemostatic. The peritoneum was closed with Monocryl in a running locked fashion and the lower part of the rectus muscle was reapproximated and closed with Monocryl with three sutures.  The fascia  was then reapproximated and closed with Vicryl in a running fashion.  Subcutaneous tissue was irrigated, dried, and found to be hemostatic with minimal use of Bovie cautery.  The skin was reapproximated and closed with staples.  The patient tolerated the procedure well without any problems.  She was taken to the recovery in stable condition.  Sponge, lap, and needle counts were correct x2.          ______________________________ Philip Aspen, DO     [place]/MEDQ  D:  07/28/2012  T:  07/28/2012  Job:  [id number]  cc:   Ilda Mori, M.D. Fax: [phone]

## 2013-08-27 LAB — OB RESULTS CONSOLE RUBELLA ANTIBODY, IGM: Rubella: IMMUNE

## 2013-08-27 LAB — OB RESULTS CONSOLE HIV ANTIBODY (ROUTINE TESTING): HIV: NONREACTIVE

## 2013-08-27 LAB — OB RESULTS CONSOLE RPR: RPR: NONREACTIVE

## 2013-08-27 LAB — OB RESULTS CONSOLE ABO/RH: RH Type: POSITIVE

## 2013-11-26 ENCOUNTER — Encounter (HOSPITAL_COMMUNITY): Payer: Self-pay | Admitting: Obstetrics and Gynecology

## 2014-02-12 ENCOUNTER — Other Ambulatory Visit: Payer: Self-pay | Admitting: Obstetrics & Gynecology

## 2014-03-20 ENCOUNTER — Encounter (HOSPITAL_COMMUNITY): Payer: Self-pay

## 2014-03-20 NOTE — Patient Instructions (Addendum)
   Your procedure is scheduled on:  Friday, Feb 26  [facility] at: 7 AM Pick up the phone at the desk and dial [phone] and inform us of your arrival.  Please call this number if you have any problems the morning of surgery: [phone]  Remember: Do not eat or drink after midnight: Thursday Take these medicines the morning of surgery with a SIP OF WATER:  None  Do not wear jewelry, make-up, or FINGER nail polish No metal in your hair or on your body. Do not wear lotions, powders, perfumes.  You may wear deodorant.  Do not bring valuables to the hospital. Contacts, dentures or bridgework may not be worn into surgery.  Leave suitcase in the car. After Surgery it may be brought to your room. For patients being admitted to the hospital, checkout time is 11:00am the day of discharge.  Home with husband Joe cell [phone]

## 2014-03-21 ENCOUNTER — Encounter (HOSPITAL_COMMUNITY)
Admission: RE | Admit: 2014-03-21 | Discharge: 2014-03-21 | Disposition: A | Discharge status: Home / Self Care | Payer: 59 | Source: Ambulatory Visit | Attending: Obstetrics & Gynecology | Admitting: Obstetrics & Gynecology

## 2014-03-21 ENCOUNTER — Encounter (HOSPITAL_COMMUNITY): Payer: Self-pay

## 2014-03-21 LAB — TYPE AND SCREEN
ABO/RH(D): O POS
ANTIBODY SCREEN: NEGATIVE

## 2014-03-21 LAB — CBC
HCT: 32.7 % — ABNORMAL LOW (ref 36.0–46.0)
Hemoglobin: 10.5 g/dL — ABNORMAL LOW (ref 12.0–15.0)
MCH: 27.3 pg (ref 26.0–34.0)
MCHC: 32.1 g/dL (ref 30.0–36.0)
MCV: 85.2 fL (ref 78.0–100.0)
Platelets: 188 10*3/uL (ref 150–400)
RBC: 3.84 MIL/uL — ABNORMAL LOW (ref 3.87–5.11)
RDW: 14.8 % (ref 11.5–15.5)
WBC: 9.3 10*3/uL (ref 4.0–10.5)

## 2014-03-22 ENCOUNTER — Inpatient Hospital Stay (HOSPITAL_COMMUNITY): Payer: 59 | Admitting: Anesthesiology

## 2014-03-22 ENCOUNTER — Inpatient Hospital Stay (HOSPITAL_COMMUNITY)
Admission: RE | Admit: 2014-03-22 | Discharge: 2014-03-24 | DRG: 765 | Disposition: A | Discharge status: Home / Self Care | Payer: 59 | Source: Ambulatory Visit | Attending: Obstetrics & Gynecology | Admitting: Obstetrics & Gynecology

## 2014-03-22 ENCOUNTER — Encounter (HOSPITAL_COMMUNITY)
Admission: RE | Disposition: A | Discharge status: Home / Self Care | Payer: Self-pay | Source: Ambulatory Visit | Attending: Obstetrics & Gynecology

## 2014-03-22 ENCOUNTER — Encounter (HOSPITAL_COMMUNITY): Payer: Self-pay | Admitting: *Deleted

## 2014-03-22 DIAGNOSIS — O9081 Anemia of the puerperium: Secondary | ICD-10-CM | POA: Diagnosis not present

## 2014-03-22 DIAGNOSIS — O3421 Maternal care for scar from previous cesarean delivery: Principal | ICD-10-CM | POA: Diagnosis present

## 2014-03-22 DIAGNOSIS — D62 Acute posthemorrhagic anemia: Secondary | ICD-10-CM | POA: Diagnosis not present

## 2014-03-22 DIAGNOSIS — K429 Umbilical hernia without obstruction or gangrene: Secondary | ICD-10-CM | POA: Diagnosis present

## 2014-03-22 DIAGNOSIS — Z880 Allergy status to penicillin: Secondary | ICD-10-CM

## 2014-03-22 DIAGNOSIS — N858 Other specified noninflammatory disorders of uterus: Secondary | ICD-10-CM | POA: Diagnosis present

## 2014-03-22 DIAGNOSIS — O9962 Diseases of the digestive system complicating childbirth: Secondary | ICD-10-CM | POA: Diagnosis present

## 2014-03-22 DIAGNOSIS — Z3A39 39 weeks gestation of pregnancy: Secondary | ICD-10-CM | POA: Diagnosis present

## 2014-03-22 DIAGNOSIS — Z885 Allergy status to narcotic agent status: Secondary | ICD-10-CM

## 2014-03-22 DIAGNOSIS — F53 Puerperal psychosis: Secondary | ICD-10-CM | POA: Diagnosis present

## 2014-03-22 DIAGNOSIS — Z87891 Personal history of nicotine dependence: Secondary | ICD-10-CM | POA: Diagnosis not present

## 2014-03-22 DIAGNOSIS — F329 Major depressive disorder, single episode, unspecified: Secondary | ICD-10-CM | POA: Diagnosis present

## 2014-03-22 DIAGNOSIS — O99344 Other mental disorders complicating childbirth: Secondary | ICD-10-CM | POA: Diagnosis present

## 2014-03-22 DIAGNOSIS — Z98891 History of uterine scar from previous surgery: Secondary | ICD-10-CM

## 2014-03-22 LAB — RPR: RPR Ser Ql: NONREACTIVE

## 2014-03-22 SURGERY — Surgical Case
Anesthesia: Spinal

## 2014-03-22 MED ORDER — NALBUPHINE HCL 10 MG/ML IJ SOLN
5.0000 mg | INTRAMUSCULAR | Status: DC | PRN
Start: 2014-03-22 — End: 2014-03-24
  Administered 2014-03-22: 5 mg via INTRAVENOUS
  Filled 2014-03-22: qty 1

## 2014-03-22 MED ORDER — ZOLPIDEM TARTRATE 5 MG PO TABS: 5.0000 mg | ORAL_TABLET | Freq: Every evening | ORAL | Status: DC | PRN

## 2014-03-22 MED ORDER — EPHEDRINE SULFATE 50 MG/ML IJ SOLN
INTRAMUSCULAR | Status: DC | PRN
  Administered 2014-03-22: 5 mg via INTRAVENOUS
  Administered 2014-03-22: 2.5 mg via INTRAVENOUS

## 2014-03-22 MED ORDER — DIPHENHYDRAMINE HCL 50 MG/ML IJ SOLN
12.5000 mg | INTRAMUSCULAR | Status: DC | PRN
  Administered 2014-03-22 (×2): 12.5 mg via INTRAVENOUS
  Filled 2014-03-22 (×2): qty 1

## 2014-03-22 MED ORDER — IBUPROFEN 600 MG PO TABS
600.0000 mg | ORAL_TABLET | Freq: Four times a day (QID) | ORAL | Status: DC
  Administered 2014-03-22 – 2014-03-24 (×8): 600 mg via ORAL
  Filled 2014-03-22 (×8): qty 1

## 2014-03-22 MED ORDER — SODIUM CHLORIDE 0.9 % IJ SOLN: 3.0000 mL | INTRAMUSCULAR | Status: DC | PRN

## 2014-03-22 MED ORDER — DIBUCAINE 1 % RE OINT: 1.0000 "application " | TOPICAL_OINTMENT | RECTAL | Status: DC | PRN

## 2014-03-22 MED ORDER — FENTANYL CITRATE 0.05 MG/ML IJ SOLN
INTRAMUSCULAR | Status: DC | PRN
  Administered 2014-03-22: 25 ug via INTRATHECAL

## 2014-03-22 MED ORDER — SIMETHICONE 80 MG PO CHEW
80.0000 mg | CHEWABLE_TABLET | ORAL | Status: DC
Start: 2014-03-23 — End: 2014-03-24
  Administered 2014-03-22 – 2014-03-24 (×2): 80 mg via ORAL
  Filled 2014-03-22 (×2): qty 1

## 2014-03-22 MED ORDER — SCOPOLAMINE 1 MG/3DAYS TD PT72
1.0000 | MEDICATED_PATCH | Freq: Once | TRANSDERMAL | Status: DC
  Filled 2014-03-22: qty 1

## 2014-03-22 MED ORDER — NALOXONE HCL 1 MG/ML IJ SOLN
1.0000 ug/kg/h | INTRAMUSCULAR | Status: DC | PRN
  Filled 2014-03-22: qty 2

## 2014-03-22 MED ORDER — OXYTOCIN 10 UNIT/ML IJ SOLN
40.0000 [IU] | INTRAVENOUS | Status: DC | PRN
  Administered 2014-03-22: 40 [IU] via INTRAVENOUS

## 2014-03-22 MED ORDER — SCOPOLAMINE 1 MG/3DAYS TD PT72
MEDICATED_PATCH | TRANSDERMAL | Status: AC
  Administered 2014-03-22: 1.5 mg via TRANSDERMAL
  Filled 2014-03-22: qty 1

## 2014-03-22 MED ORDER — NALBUPHINE HCL 10 MG/ML IJ SOLN: 5.0000 mg | Freq: Once | INTRAMUSCULAR | Status: AC | PRN

## 2014-03-22 MED ORDER — OXYCODONE-ACETAMINOPHEN 5-325 MG PO TABS: 2.0000 | ORAL_TABLET | ORAL | Status: DC | PRN

## 2014-03-22 MED ORDER — MENTHOL 3 MG MT LOZG: 1.0000 | LOZENGE | OROMUCOSAL | Status: DC | PRN

## 2014-03-22 MED ORDER — MORPHINE SULFATE 0.5 MG/ML IJ SOLN
INTRAMUSCULAR | Status: AC
  Filled 2014-03-22: qty 10

## 2014-03-22 MED ORDER — OXYTOCIN 10 UNIT/ML IJ SOLN
INTRAMUSCULAR | Status: AC
  Filled 2014-03-22: qty 4

## 2014-03-22 MED ORDER — LACTATED RINGERS IV SOLN
INTRAVENOUS | Status: DC | PRN
  Administered 2014-03-22: 09:00:00 via INTRAVENOUS

## 2014-03-22 MED ORDER — WITCH HAZEL-GLYCERIN EX PADS: 1.0000 "application " | MEDICATED_PAD | CUTANEOUS | Status: DC | PRN

## 2014-03-22 MED ORDER — SCOPOLAMINE 1 MG/3DAYS TD PT72
1.0000 | MEDICATED_PATCH | Freq: Once | TRANSDERMAL | Status: DC
  Administered 2014-03-22: 1.5 mg via TRANSDERMAL

## 2014-03-22 MED ORDER — SIMETHICONE 80 MG PO CHEW: 80.0000 mg | CHEWABLE_TABLET | ORAL | Status: DC | PRN

## 2014-03-22 MED ORDER — PHENYLEPHRINE 8 MG IN D5W 100 ML (0.08MG/ML) PREMIX OPTIME
INJECTION | INTRAVENOUS | Status: DC | PRN
  Administered 2014-03-22: 60 ug/min via INTRAVENOUS

## 2014-03-22 MED ORDER — NALBUPHINE HCL 10 MG/ML IJ SOLN
INTRAMUSCULAR | Status: AC
  Filled 2014-03-22: qty 1

## 2014-03-22 MED ORDER — ONDANSETRON HCL 4 MG/2ML IJ SOLN
INTRAMUSCULAR | Status: AC
  Filled 2014-03-22: qty 2

## 2014-03-22 MED ORDER — PRENATAL MULTIVITAMIN CH
1.0000 | ORAL_TABLET | Freq: Every day | ORAL | Status: DC
  Administered 2014-03-23 – 2014-03-24 (×2): 1 via ORAL
  Filled 2014-03-22 (×2): qty 1

## 2014-03-22 MED ORDER — OXYTOCIN 40 UNITS IN LACTATED RINGERS INFUSION - SIMPLE MED: 62.5000 mL/h | INTRAVENOUS | Status: AC

## 2014-03-22 MED ORDER — GENTAMICIN SULFATE 40 MG/ML IJ SOLN
INTRAVENOUS | Status: AC
  Administered 2014-03-22: 310 mL via INTRAVENOUS
  Filled 2014-03-22: qty 7.75

## 2014-03-22 MED ORDER — MORPHINE SULFATE (PF) 0.5 MG/ML IJ SOLN
INTRAMUSCULAR | Status: DC | PRN
  Administered 2014-03-22: .1 mg via INTRATHECAL

## 2014-03-22 MED ORDER — SODIUM BICARBONATE 8.4 % IV SOLN: INTRAVENOUS | Status: DC | PRN

## 2014-03-22 MED ORDER — SENNOSIDES-DOCUSATE SODIUM 8.6-50 MG PO TABS
2.0000 | ORAL_TABLET | ORAL | Status: DC
  Administered 2014-03-22 – 2014-03-24 (×2): 2 via ORAL
  Filled 2014-03-22 (×2): qty 2

## 2014-03-22 MED ORDER — OXYCODONE-ACETAMINOPHEN 5-325 MG PO TABS
1.0000 | ORAL_TABLET | ORAL | Status: DC | PRN
  Administered 2014-03-23 – 2014-03-24 (×2): 1 via ORAL
  Filled 2014-03-22 (×2): qty 1

## 2014-03-22 MED ORDER — LANOLIN HYDROUS EX OINT: 1.0000 "application " | TOPICAL_OINTMENT | CUTANEOUS | Status: DC | PRN

## 2014-03-22 MED ORDER — ONDANSETRON HCL 4 MG/2ML IJ SOLN: 4.0000 mg | Freq: Three times a day (TID) | INTRAMUSCULAR | Status: DC | PRN

## 2014-03-22 MED ORDER — DIPHENHYDRAMINE HCL 25 MG PO CAPS: 25.0000 mg | ORAL_CAPSULE | Freq: Four times a day (QID) | ORAL | Status: DC | PRN

## 2014-03-22 MED ORDER — ONDANSETRON HCL 4 MG/2ML IJ SOLN
INTRAMUSCULAR | Status: DC | PRN
  Administered 2014-03-22: 4 mg via INTRAVENOUS

## 2014-03-22 MED ORDER — NALOXONE HCL 0.4 MG/ML IJ SOLN: 0.4000 mg | INTRAMUSCULAR | Status: DC | PRN

## 2014-03-22 MED ORDER — KETOROLAC TROMETHAMINE 30 MG/ML IJ SOLN
INTRAMUSCULAR | Status: AC
  Filled 2014-03-22: qty 1

## 2014-03-22 MED ORDER — LACTATED RINGERS IV SOLN
INTRAVENOUS | Status: DC
  Administered 2014-03-22 (×3): via INTRAVENOUS

## 2014-03-22 MED ORDER — SIMETHICONE 80 MG PO CHEW
80.0000 mg | CHEWABLE_TABLET | Freq: Three times a day (TID) | ORAL | Status: DC
  Administered 2014-03-22 – 2014-03-24 (×5): 80 mg via ORAL
  Filled 2014-03-22 (×5): qty 1

## 2014-03-22 MED ORDER — FENTANYL CITRATE 0.05 MG/ML IJ SOLN
INTRAMUSCULAR | Status: AC
  Filled 2014-03-22: qty 2

## 2014-03-22 MED ORDER — MEPERIDINE HCL 25 MG/ML IJ SOLN: 6.2500 mg | INTRAMUSCULAR | Status: DC | PRN

## 2014-03-22 MED ORDER — ONDANSETRON HCL 4 MG/2ML IJ SOLN: 4.0000 mg | INTRAMUSCULAR | Status: DC | PRN

## 2014-03-22 MED ORDER — LACTATED RINGERS IV SOLN
INTRAVENOUS | Status: DC
  Administered 2014-03-22: 21:00:00 via INTRAVENOUS

## 2014-03-22 MED ORDER — DIPHENHYDRAMINE HCL 25 MG PO CAPS: 25.0000 mg | ORAL_CAPSULE | ORAL | Status: DC | PRN

## 2014-03-22 MED ORDER — KETOROLAC TROMETHAMINE 30 MG/ML IJ SOLN
30.0000 mg | Freq: Once | INTRAMUSCULAR | Status: AC
  Administered 2014-03-22: 30 mg via INTRAMUSCULAR

## 2014-03-22 MED ORDER — NALBUPHINE HCL 10 MG/ML IJ SOLN
5.0000 mg | INTRAMUSCULAR | Status: DC | PRN
  Administered 2014-03-22: 5 mg via SUBCUTANEOUS

## 2014-03-22 MED ORDER — ONDANSETRON HCL 4 MG PO TABS: 4.0000 mg | ORAL_TABLET | ORAL | Status: DC | PRN

## 2014-03-22 MED ORDER — 0.9 % SODIUM CHLORIDE (POUR BTL) OPTIME
TOPICAL | Status: DC | PRN
  Administered 2014-03-22: 1000 mL

## 2014-03-22 MED ORDER — TETANUS-DIPHTH-ACELL PERTUSSIS 5-2.5-18.5 LF-MCG/0.5 IM SUSP: 0.5000 mL | Freq: Once | INTRAMUSCULAR | Status: DC

## 2014-03-22 MED ORDER — PHENYLEPHRINE 8 MG IN D5W 100 ML (0.08MG/ML) PREMIX OPTIME
INJECTION | INTRAVENOUS | Status: AC
  Filled 2014-03-22: qty 100

## 2014-03-22 SURGICAL SUPPLY — 39 items
BENZOIN TINCTURE PRP APPL 2/3 (GAUZE/BANDAGES/DRESSINGS) ×3 IMPLANT
CLAMP CORD UMBIL (MISCELLANEOUS) IMPLANT
CLOSURE WOUND 1/2 X4 (GAUZE/BANDAGES/DRESSINGS) ×1
CLOTH BEACON ORANGE TIMEOUT ST (SAFETY) ×3 IMPLANT
CONTAINER PREFILL 10% NBF 15ML (MISCELLANEOUS) IMPLANT
DRAPE SHEET LG 3/4 BI-LAMINATE (DRAPES) IMPLANT
DRSG OPSITE POSTOP 4X10 (GAUZE/BANDAGES/DRESSINGS) ×3 IMPLANT
DURAPREP 26ML APPLICATOR (WOUND CARE) ×3 IMPLANT
ELECT REM PT RETURN 9FT ADLT (ELECTROSURGICAL) ×3
ELECTRODE REM PT RTRN 9FT ADLT (ELECTROSURGICAL) ×1 IMPLANT
EXTRACTOR VACUUM KIWI (MISCELLANEOUS) IMPLANT
EXTRACTOR VACUUM M CUP 4 TUBE (SUCTIONS) IMPLANT
EXTRACTOR VACUUM M CUP 4' TUBE (SUCTIONS)
GLOVE BIO SURGEON STRL SZ7 (GLOVE) ×3 IMPLANT
GLOVE BIOGEL PI IND STRL 7.0 (GLOVE) ×1 IMPLANT
GLOVE BIOGEL PI INDICATOR 7.0 (GLOVE) ×2
GOWN STRL REUS W/TWL LRG LVL3 (GOWN DISPOSABLE) ×6 IMPLANT
KIT ABG SYR 3ML LUER SLIP (SYRINGE) IMPLANT
NEEDLE HYPO 25X5/8 SAFETYGLIDE (NEEDLE) IMPLANT
NS IRRIG 1000ML POUR BTL (IV SOLUTION) ×3 IMPLANT
PACK C SECTION WH (CUSTOM PROCEDURE TRAY) ×3 IMPLANT
PAD ABD 8X7 1/2 STERILE (GAUZE/BANDAGES/DRESSINGS) ×3 IMPLANT
PAD OB MATERNITY 4.3X12.25 (PERSONAL CARE ITEMS) ×3 IMPLANT
RTRCTR C-SECT PINK 25CM LRG (MISCELLANEOUS) IMPLANT
SPONGE GAUZE 4X4 12PLY STER LF (GAUZE/BANDAGES/DRESSINGS) ×3 IMPLANT
STAPLER VISISTAT 35W (STAPLE) IMPLANT
STRIP CLOSURE SKIN 1/2X4 (GAUZE/BANDAGES/DRESSINGS) ×2 IMPLANT
SUT MON AB-0 CT1 36 (SUTURE) ×9 IMPLANT
SUT PLAIN 0 NONE (SUTURE) IMPLANT
SUT PLAIN 2 0 (SUTURE) ×2
SUT PLAIN ABS 2-0 CT1 27XMFL (SUTURE) ×1 IMPLANT
SUT VIC AB 0 CT1 27 (SUTURE) ×4
SUT VIC AB 0 CT1 27XBRD ANBCTR (SUTURE) ×2 IMPLANT
SUT VIC AB 2-0 CT1 27 (SUTURE) ×4
SUT VIC AB 2-0 CT1 TAPERPNT 27 (SUTURE) ×2 IMPLANT
SUT VIC AB 4-0 KS 27 (SUTURE) ×3 IMPLANT
SUT VICRYL 0 TIES 12 18 (SUTURE) IMPLANT
TOWEL OR 17X24 6PK STRL BLUE (TOWEL DISPOSABLE) ×3 IMPLANT
TRAY FOLEY CATH 14FR (SET/KITS/TRAYS/PACK) IMPLANT

## 2014-03-22 NOTE — Anesthesia Postprocedure Evaluation (Signed)
  Anesthesia Post Note  Patient: Alexandria DollarKasey M Brazill  Procedure(s) Performed: Procedure(s) (LRB): Repeat CESAREAN SECTION (N/A)  Anesthesia type: Spinal  Patient location: PACU  Post pain: Pain level controlled  Post assessment: Post-op Vital signs reviewed  Last Vitals:  Filed Vitals:   03/22/14 1006  BP:   Pulse:   Temp: 36.5 C  Resp: 16    Post vital signs: Reviewed  Level of consciousness: awake  Complications: No apparent anesthesia complications

## 2014-03-22 NOTE — Addendum Note (Signed)
Addendum  created 03/22/14 1317 by Jhonnie GarnerBeth M Kadyn Guild, CRNA   Modules edited: Notes Section   Notes Section:  File: [id number]

## 2014-03-22 NOTE — Anesthesia Preprocedure Evaluation (Addendum)
Anesthesia Evaluation  Patient identified by MRN, date of birth, ID band Patient awake    Reviewed: Allergy & Precautions, H&P , Patient's Chart, lab work & pertinent test results  Airway Mallampati: II  TM Distance: >3 FB Neck ROM: full    Dental no notable dental hx.    Pulmonary former smoker,  breath sounds clear to auscultation  Pulmonary exam normal       Cardiovascular Exercise Tolerance: Good Rhythm:regular Rate:Normal     Neuro/Psych    GI/Hepatic   Endo/Other    Renal/GU      Musculoskeletal   Abdominal   Peds  Hematology  (+) anemia ,   Anesthesia Other Findings   Reproductive/Obstetrics                            Anesthesia Physical Anesthesia Plan  ASA: II  Anesthesia Plan: Spinal   Post-op Pain Management:    Induction:   Airway Management Planned:   Additional Equipment:   Intra-op Plan:   Post-operative Plan:   Informed Consent: I have reviewed the patients History and Physical, chart, labs and discussed the procedure including the risks, benefits and alternatives for the proposed anesthesia with the patient or authorized representative who has indicated his/her understanding and acceptance.   Dental Advisory Given  Plan Discussed with: CRNA  Anesthesia Plan Comments: (Lab work confirmed with CRNA in room. Platelets okay. Discussed spinal anesthetic, and patient consents to the procedure:  included risk of possible headache,backache, failed block, allergic reaction, and nerve injury. This patient was asked if she had any questions or concerns before the procedure started. )        Anesthesia Quick Evaluation

## 2014-03-22 NOTE — Anesthesia Procedure Notes (Signed)
Spinal Patient location during procedure: OR Preanesthetic Checklist Completed: patient identified, site marked, surgical consent, pre-op evaluation, timeout performed, IV checked, risks and benefits discussed and monitors and equipment checked Spinal Block Patient position: sitting Prep: DuraPrep Patient monitoring: heart rate, cardiac monitor, continuous pulse ox and blood pressure Approach: midline Location: L3-4 Injection technique: single-shot Needle Needle type: Sprotte  Needle gauge: 24 G Needle length: 9 cm Assessment Sensory level: T4 Events: paresthesia Additional Notes No paresthesia with advancement of needle, but a twinge when the Asp of CSF was attempted , and then a dramatic Right buttock paresthesia with the removal of the Sprotte. Replaced to the left. No incident Spinal Dosage in OR  Bupivacaine ml       1.4 PFMS04   mcg        100 Fentanyl mcg            25

## 2014-03-22 NOTE — Anesthesia Postprocedure Evaluation (Signed)
Anesthesia Post Note  Patient: Alexandria Carr  Procedure(s) Performed: Procedure(s) (LRB): Repeat CESAREAN SECTION (N/A)  Anesthesia type: SAB  Patient location: Mother/Baby  Post pain: Pain level controlled  Post assessment: Post-op Vital signs reviewed  Last Vitals:  Filed Vitals:   03/22/14 1230  BP: 102/55  Pulse: 58  Temp: 36.7 C  Resp: 16    Post vital signs: Reviewed  Level of consciousness: awake  Complications: No apparent anesthesia complications

## 2014-03-22 NOTE — H&P (Addendum)
Alexandria Carr is a 34 y.o. female presenting for repeat C/s at 39.1 wks. No complaints. Good FMs.  Uncomplicated pregnancy, prior C/s x 3. Increased NTat 18 wks, but no other soft markers/ anomalies and declined Jeral PinchDowns' screen. No cardiac anomalies on office sono. Feels umbilical hernia, wants eval at C/s. Agree.   History OB History    Gravida Para Term Preterm AB TAB SAB Ectopic Multiple Living   4 3 2       3      Past Medical History  Diagnosis Date  . Depression     h/o post partum depression    Past Surgical History  Procedure Laterality Date  . Cesarean section  2006    [place]  . Mouth surgery    . Tonsillectomy      age 34yo  . Cesarean section  05/07/2011    Procedure: CESAREAN SECTION;  Surgeon: Philip AspenSidney Callahan, DO;  Location: [facility];  Service: Gynecology;  Laterality: N/A;  . Cesarean section N/A 07/04/2012    Procedure: REPEAT CESAREAN SECTION;  Surgeon: Philip AspenSidney Callahan, DO;  Location: [facility];  Service: Obstetrics;  Laterality: N/A;   Family History: family history is not on file. Social History:  reports that she quit smoking about 2 years ago. Her smoking use included Cigarettes. She has a 4 pack-year smoking history. She has never used smokeless tobacco. She reports that she does not drink alcohol or use illicit drugs.   Prenatal Transfer Tool  Maternal Diabetes: No Genetic Screening: Declined Maternal Ultrasounds/Referrals: Normal except increased NT at 18 wks but no other soft markers or anomalies, AGA growth.  Fetal Ultrasounds or other Referrals:  None Maternal Substance Abuse:  No Significant Maternal Medications:  None Significant Maternal Lab Results:  None GBS neg  Other Comments:  None  ROS neg   Blood pressure 112/72, pulse 93, temperature 97.9 F (36.6 C), temperature source Oral, resp. rate 20, last menstrual period 06/21/2013, SpO2 98 %, currently breastfeeding. Exam Physical Exam  A&O x 3, no acute distress. Pleasant HEENT neg, no  thyromegaly Lungs CTA bilat CV RRR, S1S2 normal Abdo soft, non tender, non acute, gravid, soft.  Extr no edema/ tenderness Pelvic closed/ long 2/22 office visit  FHT  140s Toco neg  Prenatal labs: ABO, Rh: --/--/O POS (02/25 1427) Antibody: NEG (02/25 1427) Rubella: Immune (08/03 0000) RPR: Non Reactive (02/25 1427)  HBsAg:   neg HIV: Non-reactive (08/03 0000)  GBS:   negative Glucola normal   Assessment/Plan: 34 yo, G4P3 here for 4th c/s at 39 wks. Declined TL. Check Umbilical hernia if possible repair.   Risks/complications of surgery reviewed incl infection, bleeding, damage to internal organs including bladder, bowels, ureters, blood vessels, other risks from anesthesia, VTE and delayed complications of any surgery, complications in future surgery reviewed. Also discussed neonatal complications incl difficult delivery, laceration, vacuum assistance, TTN etc. Pt understands and agrees, all concerns addressed.     Rosmery Duggin R 03/22/2014, 8:24 AM

## 2014-03-22 NOTE — Op Note (Signed)
Repeat Cesarean Section Procedure Note  Alexandria Carr 03/22/2014  Procedure: Repeat Low Transverse Cesarean section  Indications: Scheduled Proceedure/Maternal Request, 39.1 wks, prior c/section x 3  Pre-operative Diagnosis: Previous Cesarean Section x 3.   Post-operative Diagnosis: Same   Surgeon: Robley FriesVaishali R Javiana Anwar, MD   Assistants: Donette LarryMelanie Bhambri, CNM  Anesthesia: spinal   Procedure Details:  The patient was seen in the Holding Room. The risks, benefits, complications, treatment options, and expected outcomes were discussed with the patient. The patient concurred with the proposed plan, giving informed consent. identified as Alexandria Carr and the procedure verified as C-Section Delivery. Patient was brought to OR, Gentamicin/Clindamycin given pre-op per protocol.  A Time Out was held and the above information confirmed.  After induction of spinal anesthesia, foley catheter was placed and the patient was draped and prepped in the usual sterile manner. A Pfannenstiel Incision was made via prior scar and carried down through the subcutaneous tissue to the fascia. Scarred fascia was encountered. Fascial incision was made and extended transversely. The fascia was separated from the underlying rectus tissue superiorly and inferiorly. The peritoneum was identified and entered. Peritoneal incision was extended longitudinally. A well developed lower segment was noted intact. A low transverse uterine incision was made. Clear amniotic fluid noted. Delivered from cephalic presentation was a FEMALE infant at 9.04 am on 03/22/14 , cord clamped and cut and baby handed to NICU team. Apgar scores of 9 at one minute and 9 at five minutes. Cord ph was not sent. Placenta removed intact and handed off for cord blood donation and cord blood collection. Placenta, uterus, tubes and ovaries appeared normal. Slight hematuria was noted by the CRNA about the time of the delivery but resolved quickly. Uterovesical fold of  peritoneum was dissected to expose more of the lower segment under the incision to allow two layer closure. Lower segment was thin but no windows noted. .The uterine incision was closed with running locked sutures of 0Monocryl and a second imbricating layer was closed. Hemostasis was observed. Peritoneal gutters cleaned. Peritoneal closure done with 2-0 Vicryl. The fascia was then reapproximated with running sutures of 0Vicryl. The subcuticular closure was performed using 2-0plain gut. The skin was closed with 4-0Vicryl. Steristrips, honeycomb dressing and a pressure dressing applied.   Instrument, sponge, and needle counts were correct prior the abdominal closure and were correct at the conclusion of the case.   Findings: [facility] at 9.04 am on 03/22/14, Cephalic. Vigorous cry, Apgars 9 and 9. Weight estimate 8'13". Normal cord, placenta. Clear amniotic fluid. No evidence of scar dehiscence.    Estimated Blood Loss: 700 cc  Total IV Fluids: 2500 cc LR  Urine Output: 300 CC OF cloudy urine  Specimens: Cord blood   Complications: no complications  Disposition: PACU - hemodynamically stable.   Maternal Condition: stable   Baby condition / location:  Couplet care / Skin to Skin  Attending Attestation: I performed the procedure.   Signed: Surgeon(s): Robley FriesVaishali R Maiya Kates, MD

## 2014-03-22 NOTE — Transfer of Care (Signed)
Immediate Anesthesia Transfer of Care Note  Patient: Alexandria Carr  Procedure(s) Performed: Procedure(s) with comments: Repeat CESAREAN SECTION (N/A) - EDD: 03/28/14  Patient Location: PACU  Anesthesia Type:Spinal  Level of Consciousness: awake, alert  and oriented  Airway & Oxygen Therapy: Patient Spontanous Breathing  Post-op Assessment: Report given to RN and Post -op Vital signs reviewed and stable  Post vital signs: Reviewed and stable  Last Vitals:  Filed Vitals:   03/22/14 0716  BP: 112/72  Pulse: 93  Temp: 36.6 C  Resp: 20    Complications: No apparent anesthesia complications

## 2014-03-23 LAB — CBC
HCT: 27.2 % — ABNORMAL LOW (ref 36.0–46.0)
HEMOGLOBIN: 9 g/dL — AB (ref 12.0–15.0)
MCH: 28 pg (ref 26.0–34.0)
MCHC: 33.1 g/dL (ref 30.0–36.0)
MCV: 84.5 fL (ref 78.0–100.0)
Platelets: 164 10*3/uL (ref 150–400)
RBC: 3.22 MIL/uL — AB (ref 3.87–5.11)
RDW: 14.9 % (ref 11.5–15.5)
WBC: 9.6 10*3/uL (ref 4.0–10.5)

## 2014-03-23 LAB — BIRTH TISSUE RECOVERY COLLECTION (PLACENTA DONATION)

## 2014-03-23 MED ORDER — MAGNESIUM OXIDE 400 (241.3 MG) MG PO TABS
200.0000 mg | ORAL_TABLET | Freq: Every day | ORAL | Status: DC
  Administered 2014-03-23 – 2014-03-24 (×2): 200 mg via ORAL
  Filled 2014-03-23 (×2): qty 0.5

## 2014-03-23 MED ORDER — POLYSACCHARIDE IRON COMPLEX 150 MG PO CAPS
150.0000 mg | ORAL_CAPSULE | Freq: Every day | ORAL | Status: DC
  Administered 2014-03-23 – 2014-03-24 (×2): 150 mg via ORAL
  Filled 2014-03-23 (×2): qty 1

## 2014-03-23 NOTE — Progress Notes (Signed)
Clinical Social Work Department PSYCHOSOCIAL ASSESSMENT - MATERNAL/CHILD 03/23/2014  Patient:  Alexandria Carr, Alexandria Carr  Account Number:  [id number]  Admit Date:  03/22/2014  Ardine Eng Name:   Edison Pace    Clinical Social Worker:  Vivaan Helseth, LCSW   Date/Time:  03/23/2014 12:00 N  Date Referred:  03/23/2014   Referral source  Central Nursery     Referred reason  Depression/Anxiety   Other referral source:    I:  FAMILY / [place] legal guardian:  PARENT  Guardian - Name [place] - Age Guardian - Address  [place] M [address]  [place], [place] [address]  [place], JOSEPH  SAME AS ABOVE   Other household support members/support persons Other support:    II  PSYCHOSOCIAL DATA Information Source:    Occupational hygienist Employment:   Spouse is employed   Museum/gallery curator resources:  Multimedia programmer If [place] / Grade:   Maternity Care Coordinator / Child Services Coordination / Early Interventions:  Cultural issues impacting care:    III  STRENGTHS Strengths  Supportive family/friends  Home prepared for Child (including basic supplies)  Adequate Resources   Strength comment:    IV  RISK FACTORS AND CURRENT PROBLEMS Current Problem:       V  SOCIAL WORK ASSESSMENT Acknowledged order for social work consult to assess mother's hx of depression.   Met with both parents.  They are married and have 3 other dependents ages 46, 39, and 44 months.   Mother reports hx of PP depression.  Informed that father of her first child left one week before she was born and her husband is in the [employer] and was call from training shortly after the birth of both her children.  She attributes the PP Depression to these stressful events. She denies current symptoms of depression or anxiety.   She denies any hx of illicit drug use.   No acute social concerns noted or reported at this time.  Mother informed of social work Fish farm manager.      VI  SOCIAL WORK PLAN Social Work Plan  No Further Intervention Required / No Barriers to Discharge   Type of pt/family education:   PP Depression information and resources   If child protective services report - county:   If child protective services report - date:   Information/referral to community resources comment:   Other social work plan:

## 2014-03-23 NOTE — Progress Notes (Signed)
POSTOPERATIVE DAY # 2 S/P Repeat C/S, baby boy   S:         Reports feeling good, minimal pain             Tolerating po intake / no nausea / no vomiting / + flatus / no BM             Bleeding is light             Pain controlled with Motrin             Up ad lib / ambulatory/ voiding QS  Newborn breast feeding with formula supplementation  / Circumcision - planning in hospital    O:  VS: BP 91/51 mmHg  Pulse 61  Temp(Src) 98.3 F (36.8 C) (Oral)  Resp 18  Ht 5\' 3"  (1.6 m)  Wt 78.019 kg (172 lb)  BMI 30.48 kg/m2  SpO2 96%  LMP 06/21/2013  Breastfeeding? Unknown   LABS:               Recent Labs  03/21/14 1427 03/23/14 0549  WBC 9.3 9.6  HGB 10.5* 9.0*  PLT 188 164               Bloodtype: --/--/O POS (02/25 1427)  Rubella: Immune (08/03 0000)                                             I&O: Intake/Output      02/26 0701 - 02/27 0700 02/27 0701 - 02/28 0700   P.O. 2240    I.V. (mL/kg) 2600 (33.3)    Total Intake(mL/kg) 4840 (62)    Urine (mL/kg/hr) 3445    Blood 800    Total Output 4245     Net +595          Urine Occurrence 1 x                 Physical Exam:             Alert and Oriented X3  Lungs: Clear and unlabored  Heart: regular rate and rhythm / no mumurs  Abdomen: soft, non-tender, non-distended, bowel sounds actice             Fundus: firm, non-tender, U-2             Dressing: tight pressure dressing on, unable to visualize honeycomb dressing              Incision:  approximated with sutures / unable to visualize erythema / ecchymosis / drainage  Perineum: intact  Lochia: light  Extremities: trace dependent edema in feet and hands, no calf pain or tenderness, negative Homans  A:        POD # 2 S/P Repeat C/S            ABL Anemia - begin Niferex today  P:        Routine postoperative care              Niferex 150mg  daily   Magnesium Oxide 200mg  daily  Anticipate discharge home tomorrow  Alexandria CaveMeredith Temple Ewart, SNM

## 2014-03-23 NOTE — Lactation Note (Signed)
This note was copied from the chart of Alexandria Carr. Lactation Consultation Note  Initial visit made.  Breastfeeding consultation services and support information given to mom.  She states baby was not satisfied at breast so she has started formula supplementation.  She reports a low milk supply with previous babies.  DEBP setup for mom to use every 3 hours to induce lactation.  Mom will be receiving a pump from insurance company in the next few days.  Encouraged to put baby to breast prior to supplement when possible.    Patient Name: Alexandria Debroah BallerKasey Whiteford [id number]'s Date: 03/23/2014 Reason for consult: Initial assessment   Maternal Data    Feeding    LATCH Score/Interventions                      Lactation Tools Discussed/Used Pump Review: Setup, frequency, and cleaning;Milk Storage Initiated by:: Lindi AdieL Davion Meara RN, IBCLC Date initiated:: 03/23/14   Consult Status Consult Status: Follow-up Date: 03/24/14 Follow-up type: In-patient    Huston FoleyMOULDEN, Trashaun Streight S 03/23/2014, 1:54 PM

## 2014-03-24 MED ORDER — POLYSACCHARIDE IRON COMPLEX 150 MG PO CAPS: 150.0000 mg | ORAL_CAPSULE | Freq: Every day | ORAL | Status: DC

## 2014-03-24 MED ORDER — MAGNESIUM OXIDE 400 (241.3 MG) MG PO TABS: 200.0000 mg | ORAL_TABLET | Freq: Every day | ORAL | Status: DC

## 2014-03-24 MED ORDER — OXYCODONE-ACETAMINOPHEN 5-325 MG PO TABS: 1.0000 | ORAL_TABLET | ORAL | Status: DC | PRN

## 2014-03-24 MED ORDER — IBUPROFEN 600 MG PO TABS: 600.0000 mg | ORAL_TABLET | Freq: Four times a day (QID) | ORAL | Status: DC

## 2014-03-24 NOTE — Discharge Summary (Signed)
POSTOPERATIVE DISCHARGE SUMMARY:  Patient ID: Alexandria PattenKasey M Carr MRN: [id number] DOB/AGE: 33-Aug-1982 33 y.o.  Admit date: 03/22/2014 Admission Diagnoses: 39.1 weeks previous cesarean section  Discharge date:   Discharge Diagnoses: POD 2 s/p repeat cesarean section / ABL anemia  Prenatal history: [id number]   EDC : 03/28/2014, by Last Menstrual Period  [facility]  Primary provider : Mody Prenatal course complicated by previous cesarean section  Prenatal Labs: ABO, Rh: --/--/O POS (02/25 1427)  Antibody: NEG (02/25 1427) Rubella: Immune (08/03 0000)   RPR: Non Reactive (02/25 1427)  HBsAg:   negative HIV: Non-reactive (08/03 0000)  GTT : NL  Medical / Surgical History :  Past medical history:  Past Medical History  Diagnosis Date  . Depression     h/o post partum depression     Past surgical history:  Past Surgical History  Procedure Laterality Date  . Cesarean section  2006    [place]  . Mouth surgery    . Tonsillectomy      age 34yo  . Cesarean section  05/07/2011    Procedure: CESAREAN SECTION;  Surgeon: Philip AspenSidney Callahan, DO;  Location: [facility];  Service: Gynecology;  Laterality: N/A;  . Cesarean section N/A 07/04/2012    Procedure: REPEAT CESAREAN SECTION;  Surgeon: Philip AspenSidney Callahan, DO;  Location: [facility];  Service: Obstetrics;  Laterality: N/A;    Family History: History reviewed. No pertinent family history.  Social History:  reports that she quit smoking about 2 years ago. Her smoking use included Cigarettes. She has a 4 pack-year smoking history. She has never used smokeless tobacco. She reports that she does not drink alcohol or use illicit drugs.  Allergies: Codeine and Penicillins   Current Medications at time of admission:  Prior to Admission medications   Medication Sig Start Date End Date Taking? Authorizing Provider  acetaminophen (TYLENOL) 500 MG tablet Take 500 mg by mouth every 8 (eight) hours as needed for mild pain or  headache.   Yes Historical Provider, MD  calcium carbonate (TUMS - DOSED IN MG ELEMENTAL CALCIUM) 500 MG chewable tablet Chew 1 tablet by mouth 2 (two) times daily as needed for indigestion or heartburn.   Yes Historical Provider, MD  Prenatal Vit-Fe Fumarate-FA (MULTIVITAMIN-PRENATAL) 27-0.8 MG TABS Take 1 tablet by mouth daily at 12 noon.   Yes Historical Provider, MD   Procedures: Cesarean section delivery on 2/26 with delivery of  female newborn by Dr Juliene PinaMody   See operative report for further details APGAR (1 MIN): 9   APGAR (5 MINS): 9    Postoperative / postpartum course:  Uncomplicated with discharge on POD 2   Discharge Instructions:  Discharged Condition: stable  Activity: pelvic rest and postoperative restrictions x 2   Diet: routine  Medications:    Medication List    TAKE these medications        acetaminophen 500 MG tablet  Commonly known as:  TYLENOL  Take 500 mg by mouth every 8 (eight) hours as needed for mild pain or headache.     calcium carbonate 500 MG chewable tablet  Commonly known as:  TUMS - dosed in mg elemental calcium  Chew 1 tablet by mouth 2 (two) times daily as needed for indigestion or heartburn.     ibuprofen 600 MG tablet  Commonly known as:  ADVIL,MOTRIN  Take 1 tablet (600 mg total) by mouth every 6 (six) hours.     iron polysaccharides 150 MG capsule  Commonly  known as:  NIFEREX  Take 1 capsule (150 mg total) by mouth daily.     magnesium oxide 400 (241.3 MG) MG tablet  Commonly known as:  MAG-OX  Take 0.5 tablets (200 mg total) by mouth daily.     multivitamin-prenatal 27-0.8 MG Tabs tablet  Take 1 tablet by mouth daily at 12 noon.     oxyCODONE-acetaminophen 5-325 MG per tablet  Commonly known as:  PERCOCET/ROXICET  Take 1-2 tablets by mouth every 4 (four) hours as needed for moderate pain.        Wound Care: keep clean and dry / remove honeycomb POD 4 Postpartum Instructions: [facility] discharge booklet - instructions  reviewed  Discharge to: Home  Follow up :   [facility] in 6 weeks for routine postpartum visit with Dr Juliene Pina                Signed: Marlinda Mike CNM, MSN, [facility] 03/24/2014, 11:02 AM

## 2014-03-24 NOTE — Progress Notes (Signed)
POSTOPERATIVE DAY # 2 S/P S/P Repeat C/S, baby boy   S:         Reports feeling good, ready to be discharged home             Tolerating po intake / no nausea / no vomiting / + flatus / no BM             Bleeding is light             Pain controlled with Motrin and Percocet             Up ad lib / ambulatory/ voiding QS  Newborn breast feeding with formula supplementation  / Circumcision - done 2/29   O:  VS: BP 104/64 mmHg  Pulse 61  Temp(Src) 98 F (36.7 C) (Oral)  Resp 18  Ht 5\' 3"  (1.6 m)  Wt 78.019 kg (172 lb)  BMI 30.48 kg/m2  SpO2 98%  LMP 06/21/2013  Breastfeeding? Unknown   LABS:               Recent Labs  03/21/14 1427 03/23/14 0549  WBC 9.3 9.6  HGB 10.5* 9.0*  PLT 188 164               Bloodtype: --/--/O POS (02/25 1427)  Rubella: Immune (08/03 0000)                               Physical Exam:             Alert and Oriented X3  Lungs: Clear and unlabored  Heart: regular rate and rhythm / no mumurs  Abdomen: soft, non-tender, non-distended, active bowel sounds             Fundus: firm, non-tender, U-2             Dressing: Honey comb dressing recently changed / clean / dry / intact              Incision:  approximated with sutures / no erythema / no ecchymosis / no drainage  Perineum: intact  Lochia: light  Extremities: trace dependent edema in feet and hands, no calf pain or tenderness, negative Homans  A:        POD # 2 S/P S/P Repeat C/S, baby boy            ABL Anemia - on Niferex, stable   Doing well, stable   P:        Discharge home today - [facility] discharge book given and reviewed   Remove honeycomb dressing on Wednesday 3/2, but keep steri strips on for 2 weeks - keep dry   Follow-up in office for 6 week visit, plan to recheck H& H  Continue taking Niferex daily with Prenatal Vitamin with Magnesium Oxide   Milinda CaveMeredith Emersen Carroll, SNM

## 2014-03-26 ENCOUNTER — Encounter (HOSPITAL_COMMUNITY): Payer: Self-pay | Admitting: Obstetrics & Gynecology

## 2015-08-08 LAB — OB RESULTS CONSOLE RPR: RPR: NONREACTIVE

## 2015-08-08 LAB — OB RESULTS CONSOLE ABO/RH: RH Type: POSITIVE

## 2015-08-08 LAB — OB RESULTS CONSOLE GC/CHLAMYDIA
CHLAMYDIA, DNA PROBE: NEGATIVE
GC PROBE AMP, GENITAL: NEGATIVE

## 2015-08-08 LAB — OB RESULTS CONSOLE ANTIBODY SCREEN: ANTIBODY SCREEN: NEGATIVE

## 2015-08-08 LAB — OB RESULTS CONSOLE RUBELLA ANTIBODY, IGM: RUBELLA: IMMUNE

## 2015-08-08 LAB — OB RESULTS CONSOLE HIV ANTIBODY (ROUTINE TESTING): HIV: NONREACTIVE

## 2015-08-08 LAB — OB RESULTS CONSOLE HEPATITIS B SURFACE ANTIGEN: Hepatitis B Surface Ag: NEGATIVE

## 2015-12-04 ENCOUNTER — Encounter (HOSPITAL_COMMUNITY): Payer: Self-pay | Admitting: Emergency Medicine

## 2015-12-04 ENCOUNTER — Emergency Department (HOSPITAL_COMMUNITY): Payer: No Typology Code available for payment source

## 2015-12-04 ENCOUNTER — Observation Stay (HOSPITAL_COMMUNITY): Payer: No Typology Code available for payment source

## 2015-12-04 ENCOUNTER — Observation Stay (HOSPITAL_COMMUNITY)
Admission: EM | Admit: 2015-12-04 | Discharge: 2015-12-04 | Disposition: A | Discharge status: Home / Self Care | Payer: No Typology Code available for payment source | Attending: Obstetrics | Admitting: Obstetrics

## 2015-12-04 DIAGNOSIS — S4992XA Unspecified injury of left shoulder and upper arm, initial encounter: Secondary | ICD-10-CM | POA: Diagnosis not present

## 2015-12-04 DIAGNOSIS — Y998 Other external cause status: Secondary | ICD-10-CM | POA: Diagnosis not present

## 2015-12-04 DIAGNOSIS — O9A213 Injury, poisoning and certain other consequences of external causes complicating pregnancy, third trimester: Secondary | ICD-10-CM | POA: Diagnosis not present

## 2015-12-04 DIAGNOSIS — R1030 Lower abdominal pain, unspecified: Secondary | ICD-10-CM | POA: Diagnosis not present

## 2015-12-04 DIAGNOSIS — Y92410 Unspecified street and highway as the place of occurrence of the external cause: Secondary | ICD-10-CM | POA: Diagnosis not present

## 2015-12-04 DIAGNOSIS — Z3A27 27 weeks gestation of pregnancy: Secondary | ICD-10-CM | POA: Insufficient documentation

## 2015-12-04 DIAGNOSIS — R0789 Other chest pain: Secondary | ICD-10-CM | POA: Diagnosis not present

## 2015-12-04 DIAGNOSIS — Z3689 Encounter for other specified antenatal screening: Secondary | ICD-10-CM

## 2015-12-04 DIAGNOSIS — Y9389 Activity, other specified: Secondary | ICD-10-CM | POA: Diagnosis not present

## 2015-12-04 DIAGNOSIS — T1490XA Injury, unspecified, initial encounter: Secondary | ICD-10-CM | POA: Diagnosis not present

## 2015-12-04 DIAGNOSIS — O9A212 Injury, poisoning and certain other consequences of external causes complicating pregnancy, second trimester: Principal | ICD-10-CM | POA: Insufficient documentation

## 2015-12-04 DIAGNOSIS — Z3492 Encounter for supervision of normal pregnancy, unspecified, second trimester: Secondary | ICD-10-CM

## 2015-12-04 HISTORY — DX: Headache, unspecified: R51.9

## 2015-12-04 HISTORY — DX: Headache: R51

## 2015-12-04 LAB — CBC WITH DIFFERENTIAL/PLATELET
Basophils Absolute: 0 10*3/uL (ref 0.0–0.1)
Basophils Relative: 0 %
EOS PCT: 1 %
Eosinophils Absolute: 0.1 10*3/uL (ref 0.0–0.7)
HEMATOCRIT: 37.9 % (ref 36.0–46.0)
HEMOGLOBIN: 12.9 g/dL (ref 12.0–15.0)
LYMPHS ABS: 2.6 10*3/uL (ref 0.7–4.0)
LYMPHS PCT: 28 %
MCH: 30.6 pg (ref 26.0–34.0)
MCHC: 34 g/dL (ref 30.0–36.0)
MCV: 90 fL (ref 78.0–100.0)
Monocytes Absolute: 0.5 10*3/uL (ref 0.1–1.0)
Monocytes Relative: 6 %
NEUTROS ABS: 6.2 10*3/uL (ref 1.7–7.7)
NEUTROS PCT: 65 %
Platelets: 167 10*3/uL (ref 150–400)
RBC: 4.21 MIL/uL (ref 3.87–5.11)
RDW: 13.5 % (ref 11.5–15.5)
WBC: 9.5 10*3/uL (ref 4.0–10.5)

## 2015-12-04 LAB — URINALYSIS, ROUTINE W REFLEX MICROSCOPIC
Bilirubin Urine: NEGATIVE
Glucose, UA: NEGATIVE mg/dL
HGB URINE DIPSTICK: NEGATIVE
Ketones, ur: NEGATIVE mg/dL
Leukocytes, UA: NEGATIVE
Nitrite: NEGATIVE
Protein, ur: NEGATIVE mg/dL
SPECIFIC GRAVITY, URINE: 1.006 (ref 1.005–1.030)
pH: 8 (ref 5.0–8.0)

## 2015-12-04 LAB — I-STAT CHEM 8, ED
CALCIUM ION: 1.17 mmol/L (ref 1.15–1.40)
CHLORIDE: 106 mmol/L (ref 101–111)
CREATININE: 0.4 mg/dL — AB (ref 0.44–1.00)
GLUCOSE: 89 mg/dL (ref 65–99)
HCT: 37 % (ref 36.0–46.0)
Hemoglobin: 12.6 g/dL (ref 12.0–15.0)
Potassium: 3.7 mmol/L (ref 3.5–5.1)
Sodium: 137 mmol/L (ref 135–145)
TCO2: 19 mmol/L (ref 0–100)

## 2015-12-04 LAB — KLEIHAUER-BETKE STAIN
# Vials RhIg: 1
Fetal Cells %: 0.2 %
Quantitation Fetal Hemoglobin: 10 mL

## 2015-12-04 MED ORDER — ACETAMINOPHEN 325 MG PO TABS: 650.0000 mg | ORAL_TABLET | Freq: Four times a day (QID) | ORAL | 0 refills | Status: DC | PRN

## 2015-12-04 MED ORDER — ACETAMINOPHEN 325 MG PO TABS
650.0000 mg | ORAL_TABLET | ORAL | Status: DC | PRN
  Administered 2015-12-04: 650 mg via ORAL
  Filled 2015-12-04: qty 2

## 2015-12-04 MED ORDER — ACETAMINOPHEN 325 MG PO TABS
650.0000 mg | ORAL_TABLET | Freq: Once | ORAL | Status: AC
  Administered 2015-12-04: 650 mg via ORAL
  Filled 2015-12-04: qty 2
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
## 2015-12-04 NOTE — Discharge Instructions (Signed)
Preterm Birth °Preterm birth is a birth that happens before 37 weeks of pregnancy. Most pregnancies last about 39-41 weeks. Every week in the womb is important and is beneficial to the health of the infant. Infants born before 37 weeks of pregnancy are at a higher risk for complications. Depending on when the infant was born, he or she may be: °· Late preterm. Born between 32 weeks and 37 weeks of pregnancy. °· Very preterm. Born at less than 32 weeks of pregnancy. °· Extremely preterm. Born at less than 25 weeks of pregnancy. °The earlier a baby is born, the more likely the child will have issues related to prematurity. Complications and problems that can be seen in infants born too early include: °· Problems breathing (respiratory distress syndrome). °· Low birth weight. °· Problems feeding. °· Sleeping problems. °· Yellowing of the skin (jaundice). °· Infections such as pneumonia.  °Babies born very preterm or extremely preterm are at risk for more serious medical issues. These include: °· More severe breathing issues. °· Eyesight issues. °· Brain development issues (intraventricular hemorrhage). °· Behavioral and emotional development issues. °· Growth and developmental delays. °· Cerebral palsy. °· Serious feeding or bowel complications (necrotizing enterocolitis). °CAUSES  °There are two broad categories of preterm birth. °· Spontaneous preterm birth. This is a birth resulting from preterm labor (not medically induced) or preterm premature rupture of membranes (PPROM). °· Indicated preterm birth. This is a birth resulting from labor being medically induced due to health, personal, or social reasons. °RISK FACTORS °Preterm birth may be related to certain medical conditions, lifestyle factors, or demographic factors encountered by the mother or fetus. °· Medical conditions include: °¨ Multiple gestations (twins, triplets, and so on). °¨ Infection. °¨ Diabetes. °¨ Heart disease. °¨ Kidney disease. °¨ Cervical or  uterine abnormalities. °¨ Being underweight. °¨ High blood pressure or preeclampsia. °¨ Premature rupture of membranes (PROM). °¨ Birth defects in the fetus. °· Lifestyle factors include: °¨ Poor prenatal care. °¨ Poor nutrition or anemia. °¨ Cigarette smoking. °¨ Consuming alcohol. °¨ High levels of stress and lack of social or emotional support. °¨ Exposure to chemical or environmental toxins. °¨ Substance abuse. °· Demographic factors include: °¨ African-American ethnicity. °¨ Age (younger than 18 or older than 35 years of age). °¨ Low socioeconomic status. °Women with a history of preterm labor or who become pregnant within 18 months of giving birth are also at increased risk for preterm birth. °DIAGNOSIS  °Your health care provider may request additional tests to diagnose underlying complications resulting from preterm birth. Tests on the infant may include: °· Physical exam. °· Blood tests. °· Chest X-rays. °· Heart-lung monitoring. °TREATMENT  °After birth, special care will be taken to assess any problems or complications for the infant. Supportive care will be provided for the infant. Treatment depends on what problems are present and any complications that develop. Some preterm infants are cared for in a neonatal intensive care unit. In general, care may include: °· Maintaining temperature and oxygen in a clear heated box (baby isolette). °· Monitoring the infant's heart rate, breathing, and level of oxygen in the blood. °· Monitoring for signs of infection and, if needed, giving IV antibiotic medicine. °· Inserting a feeding tube (nose, mouth) or giving IV nutrition if unable to feed. °· Inserting a breathing tube (ventilation). °· Respiration support (continuous positive airway pressure [CPAP] or oxygen).  °Treatment will change as the infant builds up strength and is able to breathe and eat on his or her   own. For some infants, no special treatment is necessary. Parents may be educated on the potential  health risks of prematurity to the infant. HOME CARE INSTRUCTIONS  Understand your infant's special conditions and needs. It may be reassuring to learn about infant CPR.  Monitor your infant in the car seat until he or she grows and matures. Infant car seats can cause breathing difficulties for preterm infants.  Keep your infant warm. Dress your infant in layers and keep him or her away from drafts, especially in cold months of the year.  Wash your hands thoroughly after going to the bathroom or changing a diaper. Late preterm infants may be more prone to infection.  Follow all your health care provider's instructions for providing support and care to your preterm infant.  Get support from organizations and groups that understand your challenges.  Follow up with your infant's health care provider as directed. Prevention There are some things you can do to help lower your risk of having a preterm infant in the future. These include:  Good prenatal care throughout the entire pregnancy. See a health care provider regularly for advice and tests.  Management of underlying medical conditions.  Proper self-care and lifestyle changes.  Proper diet and weight control.  Watching for signs of various infections. SEEK MEDICAL CARE IF:  Your infant has feeding difficulties.  Your infant has sleeping difficulties.  Your infant has breathing difficulties.  Your infant's skin starts to look yellow.  Your infant shows signs of infection, such as a stuffy nose, fever, crying, or bluish color of the skin. FOR MORE INFORMATION March of Dimes: www.marchofdimes.com Prematurity.org: www.prematurity.org   This information is not intended to replace advice given to you by your health care provider. Make sure you discuss any questions you have with your health care provider.   Document Released: 04/03/2003 Document Revised: 11/01/2012 Document Reviewed: 08/10/2012 Elsevier Interactive Patient  Education 2016 Elsevier Inc.  Vaginal Bleeding During Pregnancy, Third Trimester A small amount of bleeding (spotting) from the vagina is relatively common in pregnancy. Various things can cause bleeding or spotting in pregnancy. Sometimes the bleeding is normal and is not a problem. However, bleeding during the third trimester can also be a sign of something serious for the mother and the baby. Be sure to tell your health care provider about any vaginal bleeding right away.  Some possible causes of vaginal bleeding during the third trimester include:   The placenta may be partially or completely covering the opening to the cervix (placenta previa).   The placenta may have separated from the uterus (abruption of the placenta).   There may be an infection or growth on the cervix.   You may be starting labor, called discharging of the mucus plug.   The placenta may grow into the muscle layer of the uterus (placenta accreta).  HOME CARE INSTRUCTIONS  Watch your condition for any changes. The following actions may help to lessen any discomfort you are feeling:   Follow your health care provider's instructions for limiting your activity. If your health care provider orders bed rest, you may need to stay in bed and only get up to use the bathroom. However, your health care provider may allow you to continue light activity.  If needed, make plans for someone to help with your regular activities and responsibilities while you are on bed rest.  Keep track of the number of pads you use each day, how often you change pads, and how soaked (saturated)  they are. Write this down.  Do not use tampons. Do not douche.  Do not have sexual intercourse or orgasms until approved by your health care provider.  Follow your health care provider's advice about lifting, driving, and physical activities.  If you pass any tissue from your vagina, save the tissue so you can show it to your health care  provider.   Only take over-the-counter or prescription medicines as directed by your health care provider.  Do not take aspirin because it can make you bleed.   Keep all follow-up appointments as directed by your health care provider. SEEK MEDICAL CARE IF:  You have any vaginal bleeding during any part of your pregnancy.  You have cramps or labor pains.  You have a fever, not controlled by medicine. SEEK IMMEDIATE MEDICAL CARE IF:   You have severe cramps or pain in your back or belly (abdomen).  You have chills.  You have a gush of fluid from the vagina.  You pass large clots or tissue from your vagina.  Your bleeding increases.  You feel light-headed or weak.  You pass out.  You feel less movement or no movement of the baby.  MAKE SURE YOU:  Understand these instructions.  Will watch your condition.  Will get help right away if you are not doing well or get worse.   This information is not intended to replace advice given to you by your health care provider. Make sure you discuss any questions you have with your health care provider.   Document Released: 04/03/2002 Document Revised: 01/16/2013 Document Reviewed: 09/18/2012 Elsevier Interactive Patient Education [employer]2016 Elsevier Inc.
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
## 2015-12-04 NOTE — ED Notes (Signed)
Notified [facility] for transport to MAU 

## 2015-12-04 NOTE — ED Notes (Signed)
Denny PeonErin, RN rapid response nurse at bedside-- placed pt on fetal monitor.

## 2015-12-04 NOTE — Progress Notes (Signed)
Pt seen and evaluated.  VS stable. Abdomen soft, non tender. No vag bleeding. FHT since admission- reactive. UCs -none  Plan to discharge home. PTL precautions reviewed, has f/up next week in office. Discuss PT if needed  V.Juliene PinaMody, MD
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
## 2015-12-04 NOTE — Progress Notes (Signed)
Patient requesting to walk

## 2015-12-04 NOTE — MAU Note (Signed)
[facility]. Pt c/o of mid to upper abd pain and cramping on her right side.
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
## 2015-12-06 ENCOUNTER — Encounter (HOSPITAL_COMMUNITY): Payer: Self-pay | Admitting: Obstetrics & Gynecology

## 2015-12-06 ENCOUNTER — Encounter (HOSPITAL_COMMUNITY): Payer: Self-pay
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
## 2015-12-30 ENCOUNTER — Observation Stay (HOSPITAL_COMMUNITY)
Admission: AD | Admit: 2015-12-30 | Discharge: 2015-12-31 | Disposition: A | Discharge status: Home / Self Care | Payer: Commercial Managed Care - HMO | Source: Ambulatory Visit | Attending: Obstetrics | Admitting: Obstetrics

## 2015-12-30 ENCOUNTER — Encounter (HOSPITAL_COMMUNITY): Payer: Self-pay | Admitting: *Deleted

## 2015-12-30 DIAGNOSIS — O26893 Other specified pregnancy related conditions, third trimester: Principal | ICD-10-CM | POA: Diagnosis present

## 2015-12-30 DIAGNOSIS — R109 Unspecified abdominal pain: Secondary | ICD-10-CM

## 2015-12-30 DIAGNOSIS — K429 Umbilical hernia without obstruction or gangrene: Secondary | ICD-10-CM | POA: Insufficient documentation

## 2015-12-30 DIAGNOSIS — R1032 Left lower quadrant pain: Secondary | ICD-10-CM | POA: Diagnosis not present

## 2015-12-30 DIAGNOSIS — R10A0 Flank pain, unspecified side: Secondary | ICD-10-CM

## 2015-12-30 DIAGNOSIS — Z3A31 31 weeks gestation of pregnancy: Secondary | ICD-10-CM | POA: Insufficient documentation

## 2015-12-30 HISTORY — DX: Umbilical hernia without obstruction or gangrene: K42.9

## 2015-12-30 LAB — COMPREHENSIVE METABOLIC PANEL
ALT: 11 U/L — ABNORMAL LOW (ref 14–54)
ANION GAP: 6 (ref 5–15)
AST: 15 U/L (ref 15–41)
Albumin: 2.8 g/dL — ABNORMAL LOW (ref 3.5–5.0)
Alkaline Phosphatase: 95 U/L (ref 38–126)
BILIRUBIN TOTAL: 0.5 mg/dL (ref 0.3–1.2)
BUN: 6 mg/dL (ref 6–20)
CHLORIDE: 107 mmol/L (ref 101–111)
CO2: 23 mmol/L (ref 22–32)
Calcium: 8.6 mg/dL — ABNORMAL LOW (ref 8.9–10.3)
Creatinine, Ser: 0.51 mg/dL (ref 0.44–1.00)
GFR calc Af Amer: >60 mL/min (ref >60)
GFR calc non Af Amer: >60 mL/min (ref >60)
Glucose, Bld: 90 mg/dL (ref 65–99)
Potassium: 3.9 mmol/L (ref 3.5–5.1)
Sodium: 136 mmol/L (ref 135–145)
TOTAL PROTEIN: 5.7 g/dL — AB (ref 6.5–8.1)

## 2015-12-30 LAB — CBC
HEMATOCRIT: 32.6 % — AB (ref 36.0–46.0)
Hemoglobin: 11.1 g/dL — ABNORMAL LOW (ref 12.0–15.0)
MCH: 30.2 pg (ref 26.0–34.0)
MCHC: 34 g/dL (ref 30.0–36.0)
MCV: 88.6 fL (ref 78.0–100.0)
Platelets: 172 10*3/uL (ref 150–400)
RBC: 3.68 MIL/uL — ABNORMAL LOW (ref 3.87–5.11)
RDW: 14 % (ref 11.5–15.5)
WBC: 9.2 10*3/uL (ref 4.0–10.5)

## 2015-12-30 LAB — FETAL FIBRONECTIN: Fetal Fibronectin: NEGATIVE

## 2015-12-30 LAB — URINALYSIS, ROUTINE W REFLEX MICROSCOPIC
Bilirubin Urine: NEGATIVE
Glucose, UA: NEGATIVE mg/dL
Hgb urine dipstick: NEGATIVE
Ketones, ur: NEGATIVE mg/dL
LEUKOCYTES UA: NEGATIVE
NITRITE: NEGATIVE
PH: 7 (ref 5.0–8.0)
Protein, ur: NEGATIVE mg/dL
SPECIFIC GRAVITY, URINE: 1.005 (ref 1.005–1.030)

## 2015-12-30 LAB — WET PREP, GENITAL
CLUE CELLS WET PREP: NONE SEEN
Sperm: NONE SEEN
TRICH WET PREP: NONE SEEN
YEAST WET PREP: NONE SEEN

## 2015-12-30 LAB — TYPE AND SCREEN
ABO/RH(D): O POS
Antibody Screen: NEGATIVE

## 2015-12-30 MED ORDER — PRENATAL MULTIVITAMIN CH
1.0000 | ORAL_TABLET | Freq: Every day | ORAL | Status: DC
  Administered 2015-12-31: 1 via ORAL
  Filled 2015-12-30 (×2): qty 1

## 2015-12-30 MED ORDER — ZOLPIDEM TARTRATE 5 MG PO TABS
5.0000 mg | ORAL_TABLET | Freq: Every evening | ORAL | Status: DC | PRN
  Administered 2015-12-31: 5 mg via ORAL
  Filled 2015-12-30: qty 1

## 2015-12-30 MED ORDER — CYCLOBENZAPRINE HCL 5 MG PO TABS
5.0000 mg | ORAL_TABLET | Freq: Once | ORAL | Status: AC
  Administered 2015-12-30: 5 mg via ORAL
  Filled 2015-12-30: qty 1

## 2015-12-30 MED ORDER — SODIUM CHLORIDE 0.9 % IV SOLN
INTRAVENOUS | Status: DC
  Administered 2015-12-30: via INTRAVENOUS

## 2015-12-30 MED ORDER — PANTOPRAZOLE SODIUM 40 MG IV SOLR
40.0000 mg | Freq: Once | INTRAVENOUS | Status: AC
  Administered 2015-12-30: 40 mg via INTRAVENOUS
  Filled 2015-12-30: qty 40

## 2015-12-30 MED ORDER — LACTATED RINGERS IV BOLUS (SEPSIS)
1000.0000 mL | Freq: Once | INTRAVENOUS | Status: AC
  Administered 2015-12-30: 1000 mL via INTRAVENOUS

## 2015-12-30 MED ORDER — ACETAMINOPHEN 325 MG PO TABS
650.0000 mg | ORAL_TABLET | ORAL | Status: DC | PRN
  Administered 2015-12-31 (×2): 650 mg via ORAL
  Filled 2015-12-30 (×2): qty 2

## 2015-12-30 MED ORDER — DOCUSATE SODIUM 100 MG PO CAPS
100.0000 mg | ORAL_CAPSULE | Freq: Every day | ORAL | Status: DC
  Administered 2015-12-31: 100 mg via ORAL
  Filled 2015-12-30 (×2): qty 1

## 2015-12-30 MED ORDER — CALCIUM CARBONATE ANTACID 500 MG PO CHEW: 2.0000 | CHEWABLE_TABLET | ORAL | Status: DC | PRN

## 2015-12-30 MED ORDER — ONDANSETRON HCL 4 MG/2ML IJ SOLN
4.0000 mg | Freq: Once | INTRAMUSCULAR | Status: AC
  Administered 2015-12-30: 4 mg via INTRAVENOUS
  Filled 2015-12-30: qty 2

## 2015-12-30 NOTE — MAU Note (Addendum)
Pt reports that she had some cramping last night, took some tylenol, went to sleep and felt better in the morning. At 3:30 today her legs felt like they were swelling and her cramping returned along with intermittent back pain. Pt reports that she had no appetite today, but has been able to drink. Pt now is feeling hot and sweaty.

## 2015-12-30 NOTE — MAU Note (Signed)
Pt presents complaining of regular menstrual cramping and back pain that started at 330 this afternoon. Had 2 episodes of this pain yesterday but got worse today. Denies bleeding or leaking. Reports good fetal movement.

## 2015-12-30 NOTE — MAU Provider Note (Signed)
HPI: Alycia PattenKasey M Juma is a 35 y.o. year old [id number] female at 6882w1d weeks gestation who presents to MAU reporting preterm contractions today and two episodes of feeling nauseous and clammy, one witnessed by RN. Also reports feeling urine vs fluid immediate start leaking as she soon as she would sit to void today which was a change from her normal voiding. No leaking in btw. Has been pushing PO fluids today 2/2 contractions.   Pelvic exam: Neg pool, Neg fern. Cervix 0/long/ballotable  Care assumed by Dr. Ernestina PennaFogleman.  Morgan HillVirginia Michaelia Beilfuss, CNM 12/30/2015 11:02 PM
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
## 2015-12-31 ENCOUNTER — Inpatient Hospital Stay (HOSPITAL_COMMUNITY): Payer: Commercial Managed Care - HMO

## 2015-12-31 DIAGNOSIS — O26893 Other specified pregnancy related conditions, third trimester: Secondary | ICD-10-CM | POA: Diagnosis not present

## 2015-12-31 LAB — CBC
HEMATOCRIT: 29.7 % — AB (ref 36.0–46.0)
HEMOGLOBIN: 9.9 g/dL — AB (ref 12.0–15.0)
MCH: 30.2 pg (ref 26.0–34.0)
MCHC: 33.3 g/dL (ref 30.0–36.0)
MCV: 90.5 fL (ref 78.0–100.0)
Platelets: 133 10*3/uL — ABNORMAL LOW (ref 150–400)
RBC: 3.28 MIL/uL — ABNORMAL LOW (ref 3.87–5.11)
RDW: 14 % (ref 11.5–15.5)
WBC: 8 10*3/uL (ref 4.0–10.5)

## 2015-12-31 LAB — GC/CHLAMYDIA PROBE AMP (~~LOC~~) NOT AT ARMC
Chlamydia: NEGATIVE
Neisseria Gonorrhea: NEGATIVE

## 2015-12-31 MED ORDER — ONDANSETRON 8 MG PO TBDP: 8.0000 mg | ORAL_TABLET | Freq: Two times a day (BID) | ORAL | 0 refills | Status: DC

## 2015-12-31 MED ORDER — CYCLOBENZAPRINE HCL 10 MG PO TABS: 10.0000 mg | ORAL_TABLET | Freq: Two times a day (BID) | ORAL | 0 refills | Status: DC | PRN

## 2015-12-31 NOTE — Progress Notes (Signed)
S: Pt notes rested some overnight, feels better than last night, still with some crampy pain, now located more in L flank. No contractions, no LOF, no VB. No dysuria, no hematuria. Episode of nausea with am labs.    O: Vitals:   12/30/15 2131 12/31/15 0001 12/31/15 0543 12/31/15 0700  BP: 113/73 (!) 106/59    Pulse: 86 60    Resp:  20    Temp:  97.7 F (36.5 C)    TempSrc:      Weight:   79.8 kg (176 lb 0.1 oz) 79.8 kg (176 lb)  Height:    5\' 3"  (1.6 m)   Gen: no distress, moves well in bed Back: no MSK spasm, mild CVAT on L, none on R Abd: hernia present, mild tenderness but no bulge. No fundal tenderness, no rebound, no guarding GU: deferred LE: NT, no edema  NST: starting now  CBC    Component Value Date/Time   WBC 8.0 12/31/2015 0535   RBC 3.28 (L) 12/31/2015 0535   HGB 9.9 (L) 12/31/2015 0535   HCT 29.7 (L) 12/31/2015 0535   PLT 133 (L) 12/31/2015 0535   MCV 90.5 12/31/2015 0535   MCH 30.2 12/31/2015 0535   MCHC 33.3 12/31/2015 0535   RDW 14.0 12/31/2015 0535   LYMPHSABS 2.6 12/04/2015 1033   MONOABS 0.5 12/04/2015 1033   EOSABS 0.1 12/04/2015 1033   BASOSABS 0.0 12/04/2015 1033    UA nl Ucx sent  FFN neg  A/P: 35 yo G5P4 with abdominal and L flank pain, acute in onset, improved overnight but still present. Unclear etiology. No evidence PTL. Some L CVAT but negative UA and no h/o dysuria. Will check L renal u/s, assess for hydronephrosis. Pain may be stemming from umbilical hernia, possible omental inclusion. Non-toxic, nl white count. Will advance diet if u/s stable.   - NST pending.  Fatisha Rabalais A. 12/31/2015 7:28 AM

## 2015-12-31 NOTE — Discharge Summary (Signed)
Patient ID: Alexandria Carr MRN: [id number] DOB/AGE: 09/07/1980 35 y.o.  Admit date: 12/30/2015 Discharge date: 12/31/15  Admission Diagnoses: 31w pain and ctx   Discharge Diagnoses: 31w pain and ctx          Discharged Condition: good  Hospital Course: Admitted for abdominal/ flank pain of unclear etiology. No contractions, FFN neg, cvx closed, no evidence PTL. Stable vitals and CBC, no leukocytosis. On HD#2 still with mild L flank pain and umbilical pain at her hernia. Renal u/s normal with no obstructing stone and b/l jets. Mild right hydro. UA neg. Pt continues with intermittent nausea. Still unclear etiology of pain- small renal stone, MSK spasm, omental inclusion in hernia. Pyelonephritis possible but unlikely. Will f/u on Ucx. Pt to treat with tylenol, muscle relaxer, Zofran if needed. F/u in office next week, sooner if increased pain.   Consults: None and MFM  Treatments: IV hydration  Disposition: home     Medication List    STOP taking these medications   ibuprofen 600 MG tablet Commonly known as:  ADVIL,MOTRIN   oxyCODONE-acetaminophen 5-325 MG tablet Commonly known as:  PERCOCET/ROXICET     TAKE these medications   acetaminophen 325 MG tablet Commonly known as:  TYLENOL Take 2 tablets (650 mg total) by mouth every 6 (six) hours as needed (for pain scale < 4ORtemperature>/=100.5 F). What changed:  Another medication with the same name was removed. Continue taking this medication, and follow the directions you see here.   calcium carbonate 500 MG chewable tablet Commonly known as:  TUMS - dosed in mg elemental calcium Chew 1 tablet by mouth 2 (two) times daily as needed for indigestion or heartburn.   cyclobenzaprine 10 MG tablet Commonly known as:  FLEXERIL Take 1 tablet (10 mg total) by mouth 2 (two) times daily as needed for muscle spasms.   IRON PO Take 1 tablet by mouth 3 (three) times a week.   iron polysaccharides 150 MG capsule Commonly known  as:  NIFEREX Take 1 capsule (150 mg total) by mouth daily.   magnesium oxide 400 (241.3 Mg) MG tablet Commonly known as:  MAG-OX Take 0.5 tablets (200 mg total) by mouth daily.   MAGNESIUM PO Take 1 tablet by mouth daily.   multivitamin-prenatal 27-0.8 MG Tabs tablet Take 1 tablet by mouth daily at 12 noon.   ondansetron 8 MG disintegrating tablet Commonly known as:  ZOFRAN ODT Take 1 tablet (8 mg total) by mouth 2 (two) times daily.   PROBIOTIC PO Take 1 capsule by mouth daily.   SUPER B COMPLEX PO Take 1 tablet by mouth daily.   VITAMIN D3 PO Take 1 capsule by mouth daily.        Signed: Lendon ColonelFOGLEMAN,Yojan Paskett A., MD MD 12/31/2015, 12:22 PM

## 2016-01-01 ENCOUNTER — Other Ambulatory Visit: Payer: Self-pay | Admitting: Obstetrics & Gynecology

## 2016-01-01 LAB — URINE CULTURE: CULTURE: NO GROWTH

## 2016-02-04 LAB — OB RESULTS CONSOLE GBS: STREP GROUP B AG: NEGATIVE

## 2016-02-17 ENCOUNTER — Encounter (HOSPITAL_COMMUNITY): Payer: Self-pay

## 2016-02-18 ENCOUNTER — Encounter (HOSPITAL_COMMUNITY): Payer: Self-pay

## 2016-02-18 ENCOUNTER — Inpatient Hospital Stay (HOSPITAL_COMMUNITY)
Admission: AD | Admit: 2016-02-18 | Discharge: 2016-02-18 | Disposition: A | Discharge status: Home / Self Care | Payer: Commercial Managed Care - HMO | Source: Ambulatory Visit | Attending: Obstetrics & Gynecology | Admitting: Obstetrics & Gynecology

## 2016-02-18 NOTE — MAU Note (Signed)
Urine in lab 

## 2016-02-18 NOTE — Discharge Instructions (Signed)
Braxton Hicks Contractions °Contractions of the uterus can occur throughout pregnancy. Contractions are not always a sign that you are in labor.  °WHAT ARE BRAXTON HICKS CONTRACTIONS?  °Contractions that occur before labor are called Braxton Hicks contractions, or false labor. Toward the end of pregnancy (32-34 weeks), these contractions can develop more often and may become more forceful. This is not true labor because these contractions do not result in opening (dilatation) and thinning of the cervix. They are sometimes difficult to tell apart from true labor because these contractions can be forceful and people have different pain tolerances. You should not feel embarrassed if you go to the hospital with false labor. Sometimes, the only way to tell if you are in true labor is for your health care provider to look for changes in the cervix. °If there are no prenatal problems or other health problems associated with the pregnancy, it is completely safe to be sent home with false labor and await the onset of true labor. °HOW CAN YOU TELL THE DIFFERENCE BETWEEN TRUE AND FALSE LABOR? °False Labor  °· The contractions of false labor are usually shorter and not as hard as those of true labor.   °· The contractions are usually irregular.   °· The contractions are often felt in the front of the lower abdomen and in the groin.   °· The contractions may go away when you walk around or change positions while lying down.   °· The contractions get weaker and are shorter lasting as time goes on.   °· The contractions do not usually become progressively stronger, regular, and closer together as with true labor.   °True Labor  °· Contractions in true labor last 30-70 seconds, become very regular, usually become more intense, and increase in frequency.   °· The contractions do not go away with walking.   °· The discomfort is usually felt in the top of the uterus and spreads to the lower abdomen and low back.   °· True labor can be  determined by your health care provider with an exam. This will show that the cervix is dilating and getting thinner.   °WHAT TO REMEMBER °· Keep up with your usual exercises and follow other instructions given by your health care provider.   °· Take medicines as directed by your health care provider.   °· Keep your regular prenatal appointments.   °· Eat and drink lightly if you think you are going into labor.   °· If Braxton Hicks contractions are making you uncomfortable:   °¨ Change your position from lying down or resting to walking, or from walking to resting.   °¨ Sit and rest in a tub of warm water.   °¨ Drink 2-3 glasses of water. Dehydration may cause these contractions.   °¨ Do slow and deep breathing several times an hour.   °WHEN SHOULD I SEEK IMMEDIATE MEDICAL CARE? °Seek immediate medical care if: °· Your contractions become stronger, more regular, and closer together.   °· You have fluid leaking or gushing from your vagina.   °· You have a fever.     °· You have vaginal bleeding.   °· You have continuous abdominal pain.   °· You have low back pain that you never had before.   °· You feel your baby's head pushing down and causing pelvic pressure.   °· Your baby is not moving as much as it used to.   °This information is not intended to replace advice given to you by your health care provider. Make sure you discuss any questions you have with your health care provider. °Document Released: 01/11/2005 Document   Revised: 05/05/2015 Document Reviewed: 10/23/2012 °Elsevier Interactive Patient Education © 2017 [employer] ° °

## 2016-02-18 NOTE — MAU Note (Signed)
Pt presents to MAU with ctx. Reports ctx are q 5-6 min. Pt was in the office this morning and cervix was closed. Denies any bleeding or leaking of fluid. Pt reports fetal movement, but "not as much as normal."

## 2016-02-19 ENCOUNTER — Encounter (HOSPITAL_COMMUNITY): Payer: Self-pay | Admitting: *Deleted

## 2016-02-19 ENCOUNTER — Inpatient Hospital Stay (HOSPITAL_COMMUNITY): Payer: Commercial Managed Care - HMO | Admitting: Anesthesiology

## 2016-02-19 ENCOUNTER — Inpatient Hospital Stay (HOSPITAL_COMMUNITY)
Admission: AD | Admit: 2016-02-19 | Discharge: 2016-02-22 | DRG: 765 | Disposition: A | Discharge status: Home / Self Care | Payer: Commercial Managed Care - HMO | Source: Ambulatory Visit | Attending: Obstetrics & Gynecology | Admitting: Obstetrics & Gynecology

## 2016-02-19 ENCOUNTER — Encounter (HOSPITAL_COMMUNITY)
Admission: AD | Disposition: A | Discharge status: Home / Self Care | Payer: Self-pay | Source: Ambulatory Visit | Attending: Obstetrics & Gynecology

## 2016-02-19 DIAGNOSIS — Z87891 Personal history of nicotine dependence: Secondary | ICD-10-CM | POA: Diagnosis not present

## 2016-02-19 DIAGNOSIS — D62 Acute posthemorrhagic anemia: Secondary | ICD-10-CM | POA: Diagnosis not present

## 2016-02-19 DIAGNOSIS — O2672 Subluxation of symphysis (pubis) in childbirth: Secondary | ICD-10-CM | POA: Diagnosis present

## 2016-02-19 DIAGNOSIS — L2389 Allergic contact dermatitis due to other agents: Secondary | ICD-10-CM | POA: Diagnosis not present

## 2016-02-19 DIAGNOSIS — Z302 Encounter for sterilization: Secondary | ICD-10-CM | POA: Diagnosis not present

## 2016-02-19 DIAGNOSIS — O9081 Anemia of the puerperium: Secondary | ICD-10-CM | POA: Diagnosis not present

## 2016-02-19 DIAGNOSIS — Z3A38 38 weeks gestation of pregnancy: Secondary | ICD-10-CM | POA: Diagnosis not present

## 2016-02-19 DIAGNOSIS — O34211 Maternal care for low transverse scar from previous cesarean delivery: Principal | ICD-10-CM | POA: Diagnosis present

## 2016-02-19 HISTORY — DX: Acute posthemorrhagic anemia: D62

## 2016-02-19 LAB — TYPE AND SCREEN
ABO/RH(D): O POS
ANTIBODY SCREEN: NEGATIVE

## 2016-02-19 LAB — CBC
HCT: 35 % — ABNORMAL LOW (ref 36.0–46.0)
HEMOGLOBIN: 11.7 g/dL — AB (ref 12.0–15.0)
MCH: 29 pg (ref 26.0–34.0)
MCHC: 33.4 g/dL (ref 30.0–36.0)
MCV: 86.6 fL (ref 78.0–100.0)
Platelets: 151 10*3/uL (ref 150–400)
RBC: 4.04 MIL/uL (ref 3.87–5.11)
RDW: 14.6 % (ref 11.5–15.5)
WBC: 8.6 10*3/uL (ref 4.0–10.5)

## 2016-02-19 SURGERY — Surgical Case
Anesthesia: Regional

## 2016-02-19 MED ORDER — SCOPOLAMINE 1 MG/3DAYS TD PT72: 1.0000 | MEDICATED_PATCH | Freq: Once | TRANSDERMAL | Status: DC

## 2016-02-19 MED ORDER — SIMETHICONE 80 MG PO CHEW
80.0000 mg | CHEWABLE_TABLET | ORAL | Status: DC | PRN
Start: 2016-02-19 — End: 2016-02-22

## 2016-02-19 MED ORDER — MEPERIDINE HCL 25 MG/ML IJ SOLN: 6.2500 mg | INTRAMUSCULAR | Status: DC | PRN

## 2016-02-19 MED ORDER — PRENATAL MULTIVITAMIN CH
1.0000 | ORAL_TABLET | Freq: Every day | ORAL | Status: DC
  Administered 2016-02-20 – 2016-02-21 (×2): 1 via ORAL
  Filled 2016-02-19 (×2): qty 1

## 2016-02-19 MED ORDER — PHENYLEPHRINE 8 MG IN D5W 100 ML (0.08MG/ML) PREMIX OPTIME
INJECTION | INTRAVENOUS | Status: DC | PRN
  Administered 2016-02-19: 60 ug/min via INTRAVENOUS

## 2016-02-19 MED ORDER — NALBUPHINE HCL 10 MG/ML IJ SOLN: 5.0000 mg | INTRAMUSCULAR | Status: DC | PRN

## 2016-02-19 MED ORDER — FENTANYL CITRATE (PF) 100 MCG/2ML IJ SOLN
INTRAMUSCULAR | Status: AC
  Filled 2016-02-19: qty 2

## 2016-02-19 MED ORDER — OXYTOCIN 10 UNIT/ML IJ SOLN
INTRAMUSCULAR | Status: DC | PRN
  Administered 2016-02-19: 40 [IU] via INTRAVENOUS

## 2016-02-19 MED ORDER — OXYTOCIN 40 UNITS IN LACTATED RINGERS INFUSION - SIMPLE MED: 2.5000 [IU]/h | INTRAVENOUS | Status: AC

## 2016-02-19 MED ORDER — DIPHENHYDRAMINE HCL 25 MG PO CAPS
25.0000 mg | ORAL_CAPSULE | Freq: Four times a day (QID) | ORAL | Status: DC | PRN
  Administered 2016-02-22: 25 mg via ORAL
  Filled 2016-02-19: qty 1

## 2016-02-19 MED ORDER — ONDANSETRON HCL 4 MG/2ML IJ SOLN: 4.0000 mg | Freq: Three times a day (TID) | INTRAMUSCULAR | Status: DC | PRN

## 2016-02-19 MED ORDER — DIPHENHYDRAMINE HCL 50 MG/ML IJ SOLN: 12.5000 mg | INTRAMUSCULAR | Status: DC | PRN

## 2016-02-19 MED ORDER — BUPIVACAINE IN DEXTROSE 0.75-8.25 % IT SOLN
INTRATHECAL | Status: DC | PRN
  Administered 2016-02-19: 1.6 mL via INTRATHECAL

## 2016-02-19 MED ORDER — MENTHOL 3 MG MT LOZG: 1.0000 | LOZENGE | OROMUCOSAL | Status: DC | PRN

## 2016-02-19 MED ORDER — PHENYLEPHRINE 40 MCG/ML (10ML) SYRINGE FOR IV PUSH (FOR BLOOD PRESSURE SUPPORT)
PREFILLED_SYRINGE | INTRAVENOUS | Status: AC
  Filled 2016-02-19: qty 10

## 2016-02-19 MED ORDER — COCONUT OIL OIL: 1.0000 "application " | TOPICAL_OIL | Status: DC | PRN

## 2016-02-19 MED ORDER — ONDANSETRON HCL 4 MG/2ML IJ SOLN
INTRAMUSCULAR | Status: AC
  Filled 2016-02-19: qty 2

## 2016-02-19 MED ORDER — OXYCODONE-ACETAMINOPHEN 5-325 MG PO TABS
1.0000 | ORAL_TABLET | ORAL | Status: DC | PRN
  Administered 2016-02-20 – 2016-02-22 (×8): 1 via ORAL
  Filled 2016-02-19 (×7): qty 1

## 2016-02-19 MED ORDER — KETOROLAC TROMETHAMINE 30 MG/ML IJ SOLN
INTRAMUSCULAR | Status: AC
  Filled 2016-02-19: qty 1

## 2016-02-19 MED ORDER — SODIUM CHLORIDE 0.9 % IR SOLN
Status: DC | PRN
  Administered 2016-02-19: 1

## 2016-02-19 MED ORDER — LACTATED RINGERS IV SOLN
INTRAVENOUS | Status: DC | PRN
  Administered 2016-02-19 (×2): via INTRAVENOUS

## 2016-02-19 MED ORDER — LACTATED RINGERS IV SOLN
INTRAVENOUS | Status: DC | PRN
  Administered 2016-02-19: 18:00:00 via INTRAVENOUS

## 2016-02-19 MED ORDER — NALOXONE HCL 0.4 MG/ML IJ SOLN: 0.4000 mg | INTRAMUSCULAR | Status: DC | PRN

## 2016-02-19 MED ORDER — SCOPOLAMINE 1 MG/3DAYS TD PT72
1.0000 | MEDICATED_PATCH | TRANSDERMAL | Status: DC
  Administered 2016-02-19: 1.5 mg via TRANSDERMAL
  Filled 2016-02-19: qty 1

## 2016-02-19 MED ORDER — ONDANSETRON HCL 4 MG/2ML IJ SOLN
INTRAMUSCULAR | Status: DC | PRN
  Administered 2016-02-19: 4 mg via INTRAVENOUS

## 2016-02-19 MED ORDER — ACETAMINOPHEN 325 MG PO TABS
650.0000 mg | ORAL_TABLET | ORAL | Status: DC | PRN
  Administered 2016-02-19 – 2016-02-20 (×2): 650 mg via ORAL
  Filled 2016-02-19 (×3): qty 2

## 2016-02-19 MED ORDER — PHENYLEPHRINE 8 MG IN D5W 100 ML (0.08MG/ML) PREMIX OPTIME
INJECTION | INTRAVENOUS | Status: AC
  Filled 2016-02-19: qty 100

## 2016-02-19 MED ORDER — SENNOSIDES-DOCUSATE SODIUM 8.6-50 MG PO TABS
2.0000 | ORAL_TABLET | ORAL | Status: DC
  Administered 2016-02-19 – 2016-02-21 (×3): 2 via ORAL
  Filled 2016-02-19 (×3): qty 2

## 2016-02-19 MED ORDER — MORPHINE SULFATE (PF) 0.5 MG/ML IJ SOLN
INTRAMUSCULAR | Status: DC | PRN
  Administered 2016-02-19: .2 mg via INTRATHECAL

## 2016-02-19 MED ORDER — KETOROLAC TROMETHAMINE 30 MG/ML IJ SOLN
30.0000 mg | Freq: Four times a day (QID) | INTRAMUSCULAR | Status: AC | PRN
  Administered 2016-02-20: 30 mg via INTRAVENOUS
  Filled 2016-02-19: qty 1

## 2016-02-19 MED ORDER — DIBUCAINE 1 % RE OINT: 1.0000 "application " | TOPICAL_OINTMENT | RECTAL | Status: DC | PRN

## 2016-02-19 MED ORDER — PROMETHAZINE HCL 25 MG/ML IJ SOLN: 6.2500 mg | INTRAMUSCULAR | Status: DC | PRN

## 2016-02-19 MED ORDER — LACTATED RINGERS IV SOLN
INTRAVENOUS | Status: DC
  Administered 2016-02-20: 05:00:00 via INTRAVENOUS

## 2016-02-19 MED ORDER — SIMETHICONE 80 MG PO CHEW
80.0000 mg | CHEWABLE_TABLET | ORAL | Status: DC
  Administered 2016-02-19 – 2016-02-21 (×3): 80 mg via ORAL
  Filled 2016-02-19 (×3): qty 1

## 2016-02-19 MED ORDER — SOD CITRATE-CITRIC ACID 500-334 MG/5ML PO SOLN
30.0000 mL | Freq: Once | ORAL | Status: AC
  Administered 2016-02-19: 30 mL via ORAL
  Filled 2016-02-19: qty 15

## 2016-02-19 MED ORDER — DIPHENHYDRAMINE HCL 25 MG PO CAPS: 25.0000 mg | ORAL_CAPSULE | ORAL | Status: DC | PRN

## 2016-02-19 MED ORDER — NALBUPHINE HCL 10 MG/ML IJ SOLN: 5.0000 mg | Freq: Once | INTRAMUSCULAR | Status: DC | PRN

## 2016-02-19 MED ORDER — MORPHINE SULFATE (PF) 0.5 MG/ML IJ SOLN
INTRAMUSCULAR | Status: AC
  Filled 2016-02-19: qty 10

## 2016-02-19 MED ORDER — FENTANYL CITRATE (PF) 100 MCG/2ML IJ SOLN
INTRAMUSCULAR | Status: DC | PRN
  Administered 2016-02-19: 10 ug via INTRATHECAL

## 2016-02-19 MED ORDER — OXYTOCIN 10 UNIT/ML IJ SOLN
INTRAMUSCULAR | Status: AC
  Filled 2016-02-19: qty 4

## 2016-02-19 MED ORDER — KETOROLAC TROMETHAMINE 30 MG/ML IJ SOLN
30.0000 mg | Freq: Four times a day (QID) | INTRAMUSCULAR | Status: AC | PRN
  Administered 2016-02-19: 30 mg via INTRAMUSCULAR

## 2016-02-19 MED ORDER — WITCH HAZEL-GLYCERIN EX PADS: 1.0000 "application " | MEDICATED_PAD | CUTANEOUS | Status: DC | PRN

## 2016-02-19 MED ORDER — CEFAZOLIN SODIUM-DEXTROSE 2-4 GM/100ML-% IV SOLN
2.0000 g | INTRAVENOUS | Status: AC
  Administered 2016-02-19: 2 g via INTRAVENOUS
  Filled 2016-02-19: qty 100

## 2016-02-19 MED ORDER — NALOXONE HCL 2 MG/2ML IJ SOSY: 1.0000 ug/kg/h | PREFILLED_SYRINGE | INTRAVENOUS | Status: DC | PRN

## 2016-02-19 MED ORDER — OXYCODONE-ACETAMINOPHEN 5-325 MG PO TABS
2.0000 | ORAL_TABLET | ORAL | Status: DC | PRN
  Administered 2016-02-20 – 2016-02-22 (×3): 2 via ORAL
  Filled 2016-02-19 (×4): qty 2

## 2016-02-19 MED ORDER — FAMOTIDINE IN NACL 20-0.9 MG/50ML-% IV SOLN
20.0000 mg | Freq: Once | INTRAVENOUS | Status: AC
  Administered 2016-02-19: 20 mg via INTRAVENOUS
  Filled 2016-02-19: qty 50

## 2016-02-19 MED ORDER — LACTATED RINGERS IV BOLUS (SEPSIS)
1000.0000 mL | Freq: Once | INTRAVENOUS | Status: AC
  Administered 2016-02-19: 1000 mL via INTRAVENOUS

## 2016-02-19 MED ORDER — SODIUM CHLORIDE 0.9% FLUSH: 3.0000 mL | INTRAVENOUS | Status: DC | PRN

## 2016-02-19 MED ORDER — IBUPROFEN 600 MG PO TABS
600.0000 mg | ORAL_TABLET | Freq: Four times a day (QID) | ORAL | Status: DC
  Administered 2016-02-20 – 2016-02-22 (×9): 600 mg via ORAL
  Filled 2016-02-19 (×9): qty 1

## 2016-02-19 MED ORDER — SIMETHICONE 80 MG PO CHEW
80.0000 mg | CHEWABLE_TABLET | Freq: Three times a day (TID) | ORAL | Status: DC
  Administered 2016-02-20 – 2016-02-22 (×7): 80 mg via ORAL
  Filled 2016-02-19 (×7): qty 1

## 2016-02-19 MED ORDER — TETANUS-DIPHTH-ACELL PERTUSSIS 5-2.5-18.5 LF-MCG/0.5 IM SUSP: 0.5000 mL | Freq: Once | INTRAMUSCULAR | Status: DC

## 2016-02-19 MED ORDER — HYDROMORPHONE HCL 1 MG/ML IJ SOLN: 0.2500 mg | INTRAMUSCULAR | Status: DC | PRN

## 2016-02-19 SURGICAL SUPPLY — 40 items
BENZOIN TINCTURE PRP APPL 2/3 (GAUZE/BANDAGES/DRESSINGS) ×3 IMPLANT
CHLORAPREP W/TINT 26ML (MISCELLANEOUS) ×3 IMPLANT
CLAMP CORD UMBIL (MISCELLANEOUS) IMPLANT
CLOSURE STERI-STRIP 1/2X4 (GAUZE/BANDAGES/DRESSINGS) ×1
CLOSURE WOUND 1/2 X4 (GAUZE/BANDAGES/DRESSINGS) ×1
CLOTH BEACON ORANGE TIMEOUT ST (SAFETY) ×3 IMPLANT
CLSR STERI-STRIP ANTIMIC 1/2X4 (GAUZE/BANDAGES/DRESSINGS) ×2 IMPLANT
CONTAINER PREFILL 10% NBF 15ML (MISCELLANEOUS) IMPLANT
DRSG OPSITE POSTOP 4X10 (GAUZE/BANDAGES/DRESSINGS) ×3 IMPLANT
ELECT REM PT RETURN 9FT ADLT (ELECTROSURGICAL) ×3
ELECTRODE REM PT RTRN 9FT ADLT (ELECTROSURGICAL) ×1 IMPLANT
EXTRACTOR VACUUM KIWI (MISCELLANEOUS) IMPLANT
EXTRACTOR VACUUM M CUP 4 TUBE (SUCTIONS) IMPLANT
EXTRACTOR VACUUM M CUP 4' TUBE (SUCTIONS)
GLOVE BIO SURGEON STRL SZ7 (GLOVE) ×3 IMPLANT
GLOVE BIOGEL PI IND STRL 7.0 (GLOVE) ×2 IMPLANT
GLOVE BIOGEL PI INDICATOR 7.0 (GLOVE) ×4
GOWN STRL REUS W/TWL LRG LVL3 (GOWN DISPOSABLE) ×6 IMPLANT
KIT ABG SYR 3ML LUER SLIP (SYRINGE) IMPLANT
NEEDLE HYPO 25X5/8 SAFETYGLIDE (NEEDLE) IMPLANT
NS IRRIG 1000ML POUR BTL (IV SOLUTION) ×3 IMPLANT
PACK C SECTION WH (CUSTOM PROCEDURE TRAY) ×3 IMPLANT
PAD ABD 7.5X8 STRL (GAUZE/BANDAGES/DRESSINGS) ×3 IMPLANT
PAD ABD DERMACEA PRESS 5X9 (GAUZE/BANDAGES/DRESSINGS) ×3 IMPLANT
PAD OB MATERNITY 4.3X12.25 (PERSONAL CARE ITEMS) ×3 IMPLANT
RTRCTR C-SECT PINK 25CM LRG (MISCELLANEOUS) IMPLANT
SPONGE GAUZE 4X4 12PLY STER LF (GAUZE/BANDAGES/DRESSINGS) ×6 IMPLANT
STRIP CLOSURE SKIN 1/2X4 (GAUZE/BANDAGES/DRESSINGS) ×2 IMPLANT
SUT MON AB-0 CT1 36 (SUTURE) ×9 IMPLANT
SUT PLAIN 0 NONE (SUTURE) IMPLANT
SUT PLAIN 2 0 (SUTURE)
SUT PLAIN ABS 2-0 CT1 27XMFL (SUTURE) IMPLANT
SUT VIC AB 0 CT1 27 (SUTURE) ×4
SUT VIC AB 0 CT1 27XBRD ANBCTR (SUTURE) ×2 IMPLANT
SUT VIC AB 2-0 CT1 27 (SUTURE) ×6
SUT VIC AB 2-0 CT1 TAPERPNT 27 (SUTURE) ×3 IMPLANT
SUT VIC AB 4-0 KS 27 (SUTURE) ×3 IMPLANT
SUT VICRYL 0 TIES 12 18 (SUTURE) IMPLANT
TOWEL OR 17X24 6PK STRL BLUE (TOWEL DISPOSABLE) ×3 IMPLANT
TRAY FOLEY CATH SILVER 14FR (SET/KITS/TRAYS/PACK) IMPLANT

## 2016-02-19 NOTE — Anesthesia Procedure Notes (Signed)
Spinal  Patient location during procedure: OR Start time: 02/19/2016 5:39 PM End time: 02/19/2016 5:49 PM Staffing Anesthesiologist: Heather RobertsSINGER, Alexandria Dhondt Performed: anesthesiologist  Preanesthetic Checklist Completed: patient identified, surgical consent, pre-op evaluation, timeout performed, IV checked, risks and benefits discussed and monitors and equipment checked Spinal Block Patient position: sitting Prep: DuraPrep Patient monitoring: cardiac monitor, continuous pulse ox and blood pressure Approach: midline Location: L2-3 Injection technique: single-shot Needle Needle type: Tuohy, Sprotte and Pencan  Needle gauge: 24 G Needle length: 9 cm Additional Notes Functioning IV was confirmed and monitors were applied. Sterile prep and drape, including hand hygiene and sterile gloves were used. The patient was positioned and the spine was prepped. The skin was anesthetized with lidocaine.  Free flow of clear CSF was obtained prior to injecting local anesthetic into the CSF.  The spinal needle aspirated freely following injection.  The needle was carefully withdrawn.  The patient tolerated the procedure well. Epidural catheter inserted 4cm into the EDS 12 cm at skin.

## 2016-02-19 NOTE — Transfer of Care (Signed)
Immediate Anesthesia Transfer of Care Note  Patient: Alexandria Carr  Procedure(s) Performed: Procedure(s): CESAREAN SECTION (N/A)  Patient Location: PACU  Anesthesia Type:Spinal and Epidural  Level of Consciousness: awake, alert  and oriented  Airway & Oxygen Therapy: Patient Spontanous Breathing  Post-op Assessment: Report given to RN and Post -op Vital signs reviewed and stable  Post vital signs: Reviewed and stable BP 104/52, Sao2 100%, RR 16, HR 66  Last Vitals:  Vitals:   02/19/16 1322  BP: 123/79  Pulse: 86  Resp: 18  Temp: 36.4 C    Last Pain:  Vitals:   02/19/16 1322  TempSrc: Oral  PainSc:          Complications: No apparent anesthesia complications

## 2016-02-19 NOTE — Consult Note (Signed)
Neonatology Note:   Attendance at C-section:    I was asked by Dr. Juliene PinaMody to attend this repeat C/S at 38 3/7 weeks due to onset of labor. The mother is a G5P4 O pos, GBS neg with an uncomplicated pregnancy. ROM at delivery, fluid clear. Infant vigorous with good spontaneous cry and tone. Delayed cord clamping was done. Needed only minimal bulb suctioning. Ap 9/9. Lungs clear to ausc in DR. To CN to care of Pediatrician.   Alexandria Souhristie C. Raysa Bosak, MD

## 2016-02-19 NOTE — Op Note (Signed)
Cesarean Section Procedure Note   Alexandria Carr  02/19/2016  Indications: Prior c/section x4 with worsening pain with uterine contractions, 38.3 weeks   Pre-operative Diagnosis: Repeat C/section with bilateral tubal ligation.   Post-operative Diagnosis: Same   Surgeon: Shea EvansVaishali Jaciel Diem, MD    Assistants: Ivonne Andrewaniella Paul, CNM  Anesthesia: epidural   Procedure Details:  The patient was seen in the Maternity admission area where she presented with worsening pain with uterine contractions and was observed for over 3 hours but contractions didn't space out and pain remained, she didn't dilate her cervix but had not dilated in prior pregnancies either. Decision was made to proceed with repeat C-section and she desired permanent sterilization by tubal ligation. The risks, benefits, complications, treatment options, and expected outcomes were discussed with the patient. The patient concurred with the proposed plan, giving informed consent. identified as Alexandria Carr and the procedure verified as C-Section Delivery and tubal ligation. A Time Out was held and the above information confirmed. 2 gm Ancef given.  Epidural anesthesia was chosen due to this being her fifth cesarean section. After induction of epidural anesthesia, the patient was draped and prepped in the usual sterile manner and foley was placed, clear urine draining. A Pfannenstiel Incision was made through prior scar and carefully carried down through the subcutaneous tissue to the fascia. Fascia was quite adherent to peritoneum and very thin rectus muscles bellies were noted far out laterally at the end of the incision as fascial incision was made and extended transversely. Fascia was dissected from underlying peritoneum watching carefully for any adhesions. The peritoneum was identified and entered. Peritoneal incision was extended longitudinally. The utero-vesical peritoneal reflection was incised transversely and the bladder flap was  bluntly freed from the lower uterine segment. Alexis retractor was placed. Very thin ballooned up lower segment was noted. A low transverse uterine incision was made and extended curving up. Clear amniotic fluid drained. Delivered from cephalic presentation was a vigorous baby BOY at  18.15 hours on 02/19/16 with Apgar scores of 9 at one minute and 9 at five minutes. Delayed cord clamping done at 1 minute and baby handed to NICU team in attendance. Cord ph was not indicated. Cord blood was obtained for evaluation. The placenta was removed Intact as it separated by massage and appeared normal. The uterine outline, tubes and ovaries appeared normal. The uterine incision was closed with running locked sutures of 0Monocryl in Single interlocking layer. Hemostasis was observed.  Now both ovaries and fallopian tubes were evaluated and appeared normal. So left tube grasped, window created with Bovie in the mesosalpinx and two ties of 0-Plain Gut placed far wide and a large 3 cm segment was tube was cut in between the ties to have partial salpingectomy. Hemostasis was excellent. Right tube was grasped and clear window created in mesosalpinx and partial salpingectomy including fimbriectomy was performed. Hemostasis was excellent. Both cut parts of the tubes were sent to pathology.  Alexis retractor removed. Peritoneal closure done with 2-0 vicryl.  The fascia was then reapproximated with running sutures of 0Vicryl. Scar tissue from subcutaneous layer was incised to release skin from pulling in. The subcuticular closure was performed using 4-0Vicryl. Pressure dressing, steristrips and honeycomb applied.  Instrument, sponge, and needle counts were correct prior the abdominal closure and were correct at the conclusion of the case.    Findings: very thin bulging lower segment encountered but no dehiscence noted. Amniotic fluid was clear. Baby BOY delivered at 18.15 hours on 02/19/16  cephalic from low transverse hysterotomy.  Apgars 9 and 9. Placenta normal. Cord normal. Normal tubes and ovaries. Bilateral tubal ligation performed.    Estimated Blood Loss: 750 cc  Total IV Fluids: 2300 cc LR  Urine Output: 200 ccCC OF clear urine  Specimens: Cut segments of left and right fallopian tubes, cord blood   Complications: no complications  Disposition: PACU - hemodynamically stable.   Maternal Condition: stable   Baby condition / location:  Couplet care / Skin to Skin  Attending Attestation: I performed the procedure.   Signed: Surgeon(s): Shea Evans, MD

## 2016-02-19 NOTE — MAU Note (Signed)
Pt told her pubic bone is starting to separate. Having increased pain and pressure in her pelvis. Difficulty to walk  A few mild occasional ctx. Not regular like yesterday. Good fetal movement

## 2016-02-19 NOTE — Anesthesia Preprocedure Evaluation (Signed)
Anesthesia Evaluation  Patient identified by MRN, date of birth, ID band Patient awake    Reviewed: Allergy & Precautions, H&P , NPO status , Patient's Chart, lab work & pertinent test results  Airway Mallampati: II  TM Distance: >3 FB Neck ROM: full    Dental no notable dental hx. (+) Dental Advisory Given   Pulmonary former smoker,    Pulmonary exam normal breath sounds clear to auscultation       Cardiovascular Exercise Tolerance: Good negative cardio ROS   Rhythm:regular Rate:Normal     Neuro/Psych  Headaches, PSYCHIATRIC DISORDERS Depression    GI/Hepatic negative GI ROS, Neg liver ROS,   Endo/Other  negative endocrine ROS  Renal/GU negative Renal ROS     Musculoskeletal   Abdominal   Peds  Hematology  (+) anemia ,   Anesthesia Other Findings   Reproductive/Obstetrics (+) Pregnancy                             Anesthesia Physical  Anesthesia Plan  ASA: II  Anesthesia Plan: Combined Spinal and Epidural   Post-op Pain Management:    Induction:   Airway Management Planned: Natural Airway  Additional Equipment:   Intra-op Plan:   Post-operative Plan:   Informed Consent: I have reviewed the patients History and Physical, chart, labs and discussed the procedure including the risks, benefits and alternatives for the proposed anesthesia with the patient or authorized representative who has indicated his/her understanding and acceptance.   Dental Advisory Given  Plan Discussed with: CRNA  Anesthesia Plan Comments:         Anesthesia Quick Evaluation
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
## 2016-02-19 NOTE — Anesthesia Postprocedure Evaluation (Signed)
Anesthesia Post Note  Patient: Alexandria Carr  Procedure(s) Performed: Procedure(s) (LRB): CESAREAN SECTION (N/A)  Patient location during evaluation: PACU Anesthesia Type: Combined General/Spinal Level of consciousness: oriented and awake and alert Pain management: pain level controlled Vital Signs Assessment: post-procedure vital signs reviewed and stable Respiratory status: spontaneous breathing, respiratory function stable and patient connected to nasal cannula oxygen Cardiovascular status: blood pressure returned to baseline and stable Postop Assessment: no headache, no backache, spinal receding, no signs of nausea or vomiting and patient able to bend at knees Anesthetic complications: no        Last Vitals:  Vitals:   02/19/16 2005 02/19/16 2019  BP:  107/68  Pulse: (!) 58 (!) 53  Resp: (!) 21 20  Temp:      Last Pain:  Vitals:   02/19/16 2020  TempSrc:   PainSc: 0-No pain   Pain Goal:                 Cecile HearingStephen Edward Tevin Shillingford
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
## 2016-02-20 ENCOUNTER — Encounter (HOSPITAL_COMMUNITY): Payer: Self-pay | Admitting: Obstetrics and Gynecology

## 2016-02-20 DIAGNOSIS — D62 Acute posthemorrhagic anemia: Secondary | ICD-10-CM

## 2016-02-20 HISTORY — DX: Acute posthemorrhagic anemia: D62

## 2016-02-20 LAB — CBC
HEMATOCRIT: 28.2 % — AB (ref 36.0–46.0)
HEMOGLOBIN: 9.7 g/dL — AB (ref 12.0–15.0)
MCH: 30 pg (ref 26.0–34.0)
MCHC: 34.4 g/dL (ref 30.0–36.0)
MCV: 87.3 fL (ref 78.0–100.0)
Platelets: 125 10*3/uL — ABNORMAL LOW (ref 150–400)
RBC: 3.23 MIL/uL — ABNORMAL LOW (ref 3.87–5.11)
RDW: 14.6 % (ref 11.5–15.5)
WBC: 8 10*3/uL (ref 4.0–10.5)

## 2016-02-20 LAB — RPR: RPR: NONREACTIVE

## 2016-02-20 MED ORDER — MAGNESIUM OXIDE 400 (241.3 MG) MG PO TABS
400.0000 mg | ORAL_TABLET | Freq: Every day | ORAL | Status: DC
  Administered 2016-02-20 – 2016-02-22 (×3): 400 mg via ORAL
  Filled 2016-02-20 (×4): qty 1

## 2016-02-20 MED ORDER — POLYSACCHARIDE IRON COMPLEX 150 MG PO CAPS
150.0000 mg | ORAL_CAPSULE | Freq: Every day | ORAL | Status: DC
  Administered 2016-02-20 – 2016-02-22 (×3): 150 mg via ORAL
  Filled 2016-02-20 (×3): qty 1

## 2016-02-20 NOTE — Addendum Note (Signed)
Addendum  created 02/20/16 1323 by Angela Adamana G Jerett Odonohue, CRNA   Charge Capture section accepted, Sign clinical note, Visit diagnoses modified

## 2016-02-20 NOTE — Progress Notes (Signed)
CSW received consult for hx of postpartum depression.  CSW reviewed MOB's medical record and notes that this diagnosis is not listed in her current PNR.  CSW also notes psychosocial assessments completed during delivery admission in 2016, which notes that MOB reported PPD after her first child and attributed it to her current situation at that time.  She has had multiple deliveries since with no concerns noted.  CSW contacted bedside RN to see if there have been any emotional concerns noted by nursing staff.  Bedside RN states no concerns.  CSW does not feel it is necessary to discuss this remote hx with MOB again at this time and is therefore screening out referral.  CSW asked bedside RN to call CSW if current concerns arise.

## 2016-02-20 NOTE — Plan of Care (Signed)
Problem: Activity: Goal: Ability to tolerate increased activity will improve Outcome: Completed/Met Date Met: 02/20/16 Pt ambulating independently in the room.  Encouraged to ambulate in the hallway.   Problem: Urinary Elimination: Goal: Ability to reestablish a normal urinary elimination pattern will improve Outcome: Completed/Met Date Met: 02/20/16 Pt voiding frequently and without difficulty.

## 2016-02-20 NOTE — Anesthesia Postprocedure Evaluation (Signed)
Anesthesia Post Note  Patient: Alexandria Carr  Procedure(s) Performed: Procedure(s) (LRB): CESAREAN SECTION (N/A)  Patient location during evaluation: Mother Baby Anesthesia Type: Epidural Level of consciousness: awake and alert, oriented and patient cooperative Pain management: pain level controlled Vital Signs Assessment: post-procedure vital signs reviewed and stable Respiratory status: spontaneous breathing Cardiovascular status: stable Postop Assessment: no headache, epidural receding, patient able to bend at knees and no signs of nausea or vomiting Anesthetic complications: no Comments: Pain score 3.  RN at bedside with pain medication.          Last Vitals:  Vitals:   02/20/16 0830 02/20/16 1249  BP: 110/69 112/60  Pulse: (!) 57 63  Resp: 18 18  Temp: 36.8 C 36.9 C    Last Pain:  Vitals:   02/20/16 1319  TempSrc:   PainSc: 7    Pain Goal: Patients Stated Pain Goal: 3 (02/20/16 0915)               Merrilyn PumaWRINKLE,Drema Eddington

## 2016-02-20 NOTE — Progress Notes (Signed)
Patient ID: Alexandria Carr, female   DOB: Mar 16, 1980, 36 y.o.   MRN: [id number] Subjective: S/P Elective Repeat Cesarean Delivery  POD# 1 Information for the patient's newborn:  Olam IdlerRierson, Boy [place]ia [[id number]][[id number]]  female  / circ planning  Reports feeling well. "Feels like a vacation being away from home for a little while." Feeding: breast Patient reports tolerating PO.  Breast symptoms: none Pain controlled with ibuprofen (OTC) Denies HA/SOB/C/P/N/V/dizziness. Flatus present. No BM. She reports vaginal bleeding as normal, without clots.  She is ambulating, urinating without difficult x 1 since removal of Foley.     Objective:   VS:  Vitals:   02/19/16 2145 02/19/16 2325 02/20/16 0020 02/20/16 0430  BP: 114/64 102/64 (!) 95/55   Pulse: (!) 58 77 67   Resp: 18 18 17 18   Temp: 98.2 F (36.8 C) 97.7 F (36.5 C) 98 F (36.7 C) 98.1 F (36.7 C)  TempSrc: Oral Oral Oral Oral  SpO2: 99% 97% 96% 99%     Intake/Output Summary (Last 24 hours) at 02/20/16 0901 Last data filed at 02/20/16 [id number]  Gross per 24 hour  Intake             2843 ml  Output             2575 ml  Net              268 ml        Recent Labs  02/19/16 1553 02/20/16 0605  WBC 8.6 8.0  HGB 11.7* 9.7*  HCT 35.0* 28.2*  PLT 151 125*     Blood type: --/--/O POS (01/25 1553)  Rubella: Immune (07/14 0000)     Physical Exam:   General: alert, cooperative, fatigued and no distress  CV: Regular rate and rhythm, S1S2 present or without murmur or extra heart sounds  Resp: clear  Abdomen: soft, nontender, normal bowel sounds  Incision: Covered with pressure dressing - C/D/I  Uterine Fundus: firm, 1 FB below umbilicus, nontender  Lochia: minimal  Ext: extremities normal, atraumatic, no cyanosis or edema, Homans sign is negative, no sign of DVT and no edema, redness or tenderness in the calves or thighs   Assessment/Plan: 36 y.o.   POD# 1.  S/P Cesarean Delivery.  Indications: elective repeat                 Principal Problem:   Postpartum care following repeat cesarean delivery (1/25) Active Problems:   Encounter for maternal care for low transverse scar from repeat cesarean delivery   Postoperative anemia due to acute blood loss  Doing well, stable.               Regular diet as tolerated D/C IV per unit protocol Ambulate Routine post-op care  Kenard GowerAWSON, Blakeleigh Domek, M, MSN, CNM 02/20/2016, 8:50 AM

## 2016-02-21 LAB — BIRTH TISSUE RECOVERY COLLECTION (PLACENTA DONATION)

## 2016-02-21 MED ORDER — HYDROCORTISONE 1 % EX CREA
TOPICAL_CREAM | Freq: Three times a day (TID) | CUTANEOUS | Status: DC
  Administered 2016-02-21 – 2016-02-22 (×4): via TOPICAL
  Filled 2016-02-21: qty 28

## 2016-02-21 NOTE — Progress Notes (Signed)
Subjective: POD# 2 Information for the patient's newborn:  Alexandria Carr, Alexandria Carr [[id number]][[id number]]  female   circ completed 02/20/16  Baby name: Karleen HampshireSpencer  Reports feeling tired but well Feeding: bottle Patient reports tolerating PO.  Breast symptoms: none Pain controlled withmotrin and Percocet x1 Denies HA/SOB/C/P/N/V/dizziness. Flatus present and bm x1. She reports vaginal bleeding as normal, without clots. She is ambulating, urinating without difficulty.     Objective:   VS:    Vitals:   02/20/16 0830 02/20/16 1249 02/20/16 1630 02/21/16 0613  BP: 110/69 112/60 103/61 110/64  Pulse: (!) 57 63 62 60  Resp: 18 18 16 18   Temp: 98.3 F (36.8 C) 98.4 F (36.9 C) 97.9 F (36.6 C) 98.3 F (36.8 C)  TempSrc: Oral Oral Oral Oral  SpO2: 99% 100% 99%   Weight:      Height:        No intake or output data in the 24 hours ending 02/21/16 1114      Recent Labs  02/19/16 1553 02/20/16 0605  WBC 8.6 8.0  HGB 11.7* 9.7*  HCT 35.0* 28.2*  PLT 151 125*     Blood type: --/--/O POS (01/25 1553)  Rubella: Immune (07/14 0000)     Physical Exam:  General: alert, cooperative and no distress CV: Regular rate and rhythm, S1S2 present or without murmur or extra heart sounds Resp: clear Abdomen: soft, nontender, normal bowel sounds Incision: clean, dry and intact Uterine Fundus: firm, below umbilicus, nontender Lochia: minimal Ext: edema mild pedal and Homans sign is negative, no sign of DVT      Assessment/Plan: 36 y.o.   POD# 2. [id number]                  Principal Problem:   Postpartum care following repeat cesarean delivery (1/25)        Doing well, stable.   Advance diet as tolerated  Encourage rest when baby rests  Encourage to ambulate  Routine post-op care Active Problems:     Postoperative anemia due to acute blood loss  Asymptomatic  Iron supplement and mag ox supplement         Apolonio Schneidersinthya Bowmaker-Kareen, SNM, MSN 02/21/2016, 11:14 AM

## 2016-02-22 MED ORDER — POLYSACCHARIDE IRON COMPLEX 150 MG PO CAPS: 150.0000 mg | ORAL_CAPSULE | Freq: Every day | ORAL | 1 refills | Status: DC

## 2016-02-22 MED ORDER — DIPHENHYDRAMINE HCL 25 MG PO CAPS: 25.0000 mg | ORAL_CAPSULE | Freq: Four times a day (QID) | ORAL | 0 refills | Status: DC | PRN

## 2016-02-22 MED ORDER — TRIAMCINOLONE ACETONIDE 0.5 % EX OINT: 1.0000 "application " | TOPICAL_OINTMENT | Freq: Four times a day (QID) | CUTANEOUS | 2 refills | Status: DC

## 2016-02-22 MED ORDER — IBUPROFEN 600 MG PO TABS: 600.0000 mg | ORAL_TABLET | Freq: Four times a day (QID) | ORAL | 0 refills | Status: DC

## 2016-02-22 MED ORDER — OXYCODONE-ACETAMINOPHEN 5-325 MG PO TABS: 1.0000 | ORAL_TABLET | ORAL | 0 refills | Status: DC | PRN

## 2016-02-22 NOTE — Discharge Summary (Signed)
       OB Discharge Summary  Patient Name: Alexandria Carr DOB: 1980/12/29 MRN: [id number]  Date of admission: 02/19/2016 Delivering MD: MODY, VAISHALI   Date of discharge: 02/22/2016  Admitting diagnosis: 38WKS. CTX,PELVIC PAIN Intrauterine pregnancy: 576w3d     Secondary diagnosis:Principal Problem:   Postpartum care following repeat cesarean delivery (1/25) Active Problems:   Encounter for maternal care for low transverse scar from repeat cesarean delivery   Postoperative anemia due to acute blood loss  Additional problems:contact dermatitis     Discharge diagnosis: Term Pregnancy Delivered                                                                     Post partum procedures:postpartum tubal ligation  Augmentation: none  Complications: None  Hospital course:  Sceduled C/S   36 y.o. yo [id number] at 4376w3d was admitted to the hospital 02/19/2016 for scheduled cesarean section with the following indication:Elective Repeat.  Membrane Rupture Time/Date: 6:14 PM ,02/19/2016   Patient delivered a Viable infant.02/19/2016  Details of operation can be found in separate operative note.  Pateint had an uncomplicated postpartum course.  She is ambulating, tolerating a regular diet, passing flatus, and urinating well. Patient is discharged home in stable condition on  02/22/16         Physical exam  Vitals:   02/20/16 1630 02/21/16 0613 02/21/16 1758 02/22/16 0643  BP: 103/61 110/64 119/73 118/74  Pulse: 62 60 66 69  Resp: 16 18 18 18   Temp: 97.9 F (36.6 C) 98.3 F (36.8 C) 97.5 F (36.4 C) 97.9 F (36.6 C)  TempSrc: Oral Oral Oral Oral  SpO2: 99%     Weight:      Height:       General: alert Lochia: appropriate Uterine Fundus: firm Incision: healing well, surrounding contact dermatitis DVT Evaluation: No evidence of DVT seen on physical exam. Labs: Lab Results  Component Value Date   WBC 8.0 02/20/2016   HGB 9.7 (L) 02/20/2016   HCT 28.2 (L) 02/20/2016   MCV 87.3  02/20/2016   PLT 125 (L) 02/20/2016   CMP Latest Ref Rng & Units 12/30/2015  Glucose 65 - 99 mg/dL 90  BUN 6 - 20 mg/dL 6  Creatinine 1.470.44 - 8.291.00 mg/dL 5.620.51  Sodium 130135 - 865145 mmol/L 136  Potassium 3.5 - 5.1 mmol/L 3.9  Chloride 101 - 111 mmol/L 107  CO2 22 - 32 mmol/L 23  Calcium 8.9 - 10.3 mg/dL 7.8(I8.6(L)  Total Protein 6.5 - 8.1 g/dL 6.9(G5.7(L)  Total Bilirubin 0.3 - 1.2 mg/dL 0.5  Alkaline Phos 38 - 126 U/L 95  AST 15 - 41 U/L 15  ALT 14 - 54 U/L 11(L)    Discharge instruction: per After Visit Summary and "Baby and Me Booklet".  After Visit Meds:   Diet: routine diet  Activity: Advance as tolerated. Pelvic rest for 6 weeks.   Outpatient follow up:6 weeks Follow up Appt:No future appointments. Follow up visit: No Follow-up on file.  Postpartum contraception: Tubal Ligation  Newborn Data: Live born female  Birth Weight: 8 lb 3.8 oz (3735 g) APGAR: 9, 9  Baby Feeding: Bottle Disposition:home with mother   02/22/2016 Lendon ColonelFOGLEMAN,Robyn Galati A., MD

## 2016-02-22 NOTE — Progress Notes (Addendum)
Subjective: POD# 3 Information for the patient's newborn:  Olam IdlerRierson, Boy Jaiah [[id number]][[id number]]  female   circ completed Baby name: Karleen HampshireSpencer  Reports feeling well, ready for DC home. Feeding: bottle Patient reports tolerating PO.  Breast symptoms: none Pain controlled with PO meds Denies HA/SOB/C/P/N/V/dizziness. Flatus present, + BM. She reports vaginal bleeding as normal, without clots.  She is ambulating, urinating without difficulty.     Reports increased itching at incision site, and back rash feels the same, using topical hydrocortisone on back  Objective:   VS:    Vitals:   02/20/16 1630 02/21/16 0613 02/21/16 1758 02/22/16 0643  BP: 103/61 110/64 119/73 118/74  Pulse: 62 60 66 69  Resp: 16 18 18 18   Temp: 97.9 F (36.6 C) 98.3 F (36.8 C) 97.5 F (36.4 C) 97.9 F (36.6 C)  TempSrc: Oral Oral Oral Oral  SpO2: 99%     Weight:      Height:        No intake or output data in the 24 hours ending 02/22/16 0908      Recent Labs  02/19/16 1553 02/20/16 0605  WBC 8.6 8.0  HGB 11.7* 9.7*  HCT 35.0* 28.2*  PLT 151 125*     Blood type: --/--/O POS (01/25 1553)  Rubella: Immune (07/14 0000)     Physical Exam:  General: alert, cooperative and no distress  Skin: fine diffuse macular rash on lower and mid back, c/w site of antiseptic prep for spinal Abdomen: soft, nontender, normal bowel sounds Incision: erythematous and no drainage, extending 3-4 cm from incision line and well circumscribed around incision edges, + edema, no fluctuance. Honeycomb dressing And steristrips removed, skin approximated w/ suture Uterine Fundus: firm, below umbilicus, nontender Lochia: minimal Ext: edema trace, no calf tenderness   Assessment/Plan: 36 y.o.   POD# 3. [id number]                  Principal Problem:   Postpartum care following repeat cesarean delivery and BTL(1/25) Active Problems:   Encounter for maternal care for low transverse scar from repeat cesarean delivery  Postoperative anemia due to acute blood loss  Started oral Fe and Mag Ox Contact dermatitis on back, suspect reaction to antiseptic solution used to clean spinal site  Hydrocortisone topical TID LTS incision erythema, unclear etiology, patient denies hx of allergic reaction to adhesives with previous sections  steristrips removed along with honeycomb  Consult w/ Dr. Ernestina PennaFogleman, MD to come assess patient.              Neta Mendsaniela C Paul, CNM, MSN 02/22/2016, 9:08 AM   Pt notes itchiness at skin around incision. Never with reaction to steri strips or c/s bandage in past. No fevers, no pain, no fluid leaking from site.  Vitals:   02/21/16 1758 02/22/16 0643  BP: 119/73 118/74  Pulse: 66 69  Resp: 18 18  Temp: 97.5 F (36.4 C) 97.9 F (36.6 C)    Skin: erythema and swelling at the area where the honeycomb dressing was. No induration, no fluctuance, no discharge from incision.  A/P: allergic reaction to dressing. Remove dressing, treat with topical steroids. Pt will continue to assess for signs of wound infection.  Ryenn Howeth A. 02/22/2016 10:52 AM

## 2016-02-23 ENCOUNTER — Encounter (HOSPITAL_COMMUNITY)
Admission: RE | Admit: 2016-02-23 | Discharge: 2016-02-23 | Disposition: A | Discharge status: Home / Self Care | Payer: Commercial Managed Care - HMO | Source: Ambulatory Visit

## 2016-02-24 ENCOUNTER — Inpatient Hospital Stay (HOSPITAL_COMMUNITY)
Admission: RE | Admit: 2016-02-24 | Payer: Commercial Managed Care - HMO | Source: Ambulatory Visit | Admitting: Obstetrics & Gynecology

## 2016-02-24 ENCOUNTER — Encounter (HOSPITAL_COMMUNITY): Admission: RE | Payer: Self-pay | Source: Ambulatory Visit

## 2016-02-24 SURGERY — Surgical Case
Anesthesia: Spinal | Laterality: Bilateral

## 2016-08-04 ENCOUNTER — Other Ambulatory Visit: Payer: Self-pay | Admitting: Physician Assistant

## 2017-08-04 ENCOUNTER — Other Ambulatory Visit: Payer: Self-pay
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
## 2017-12-03 IMAGING — US US RENAL
1 series · 15 of 25 positions shown · non-contrast
Comparison: None.

CLINICAL DATA: Left flank pain.

EXAM:
RENAL / URINARY TRACT ULTRASOUND COMPLETE

[Series 1: us renal · 15 of 37 slices shown]
[im 1/37]
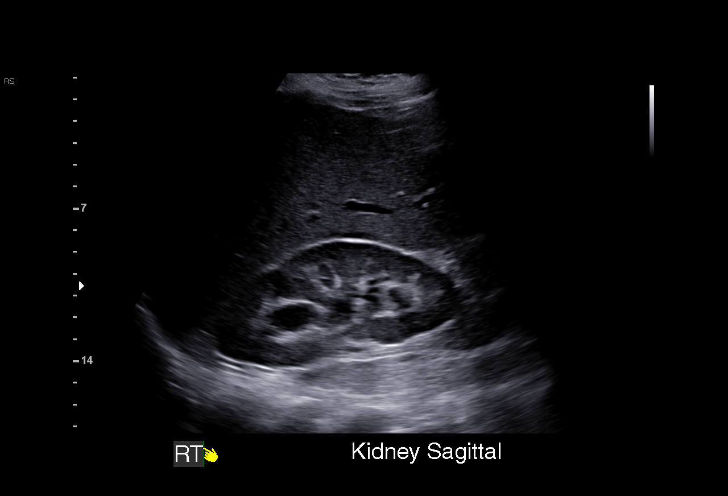
[im 4/37]
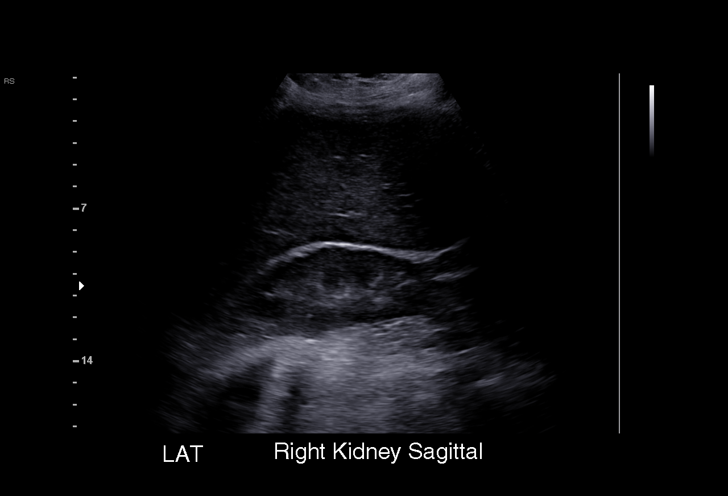
[im 7/37]
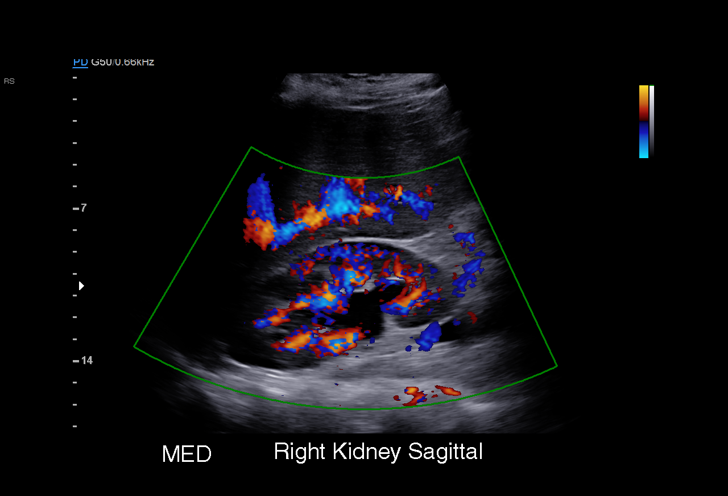
[im 8/37]
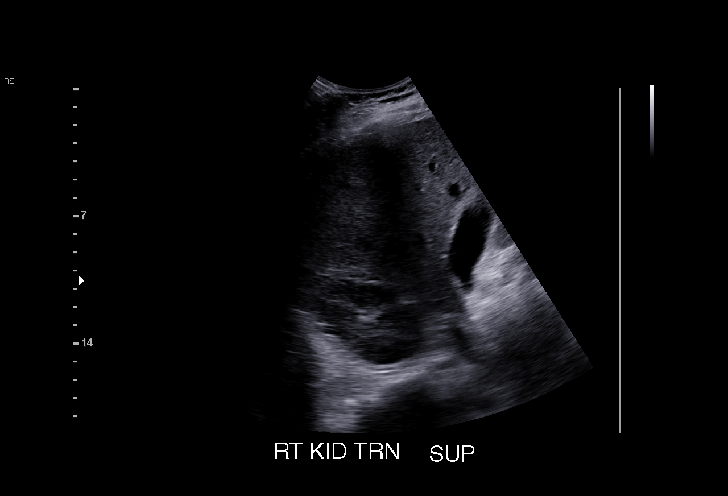
[im 11/37]
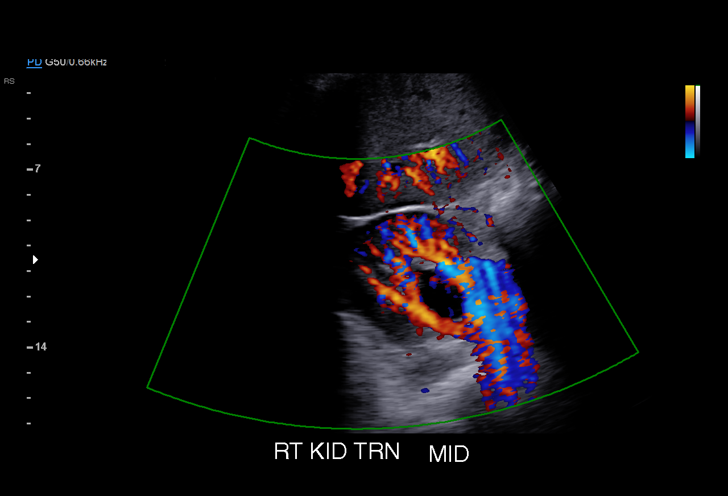
[im 14/37]
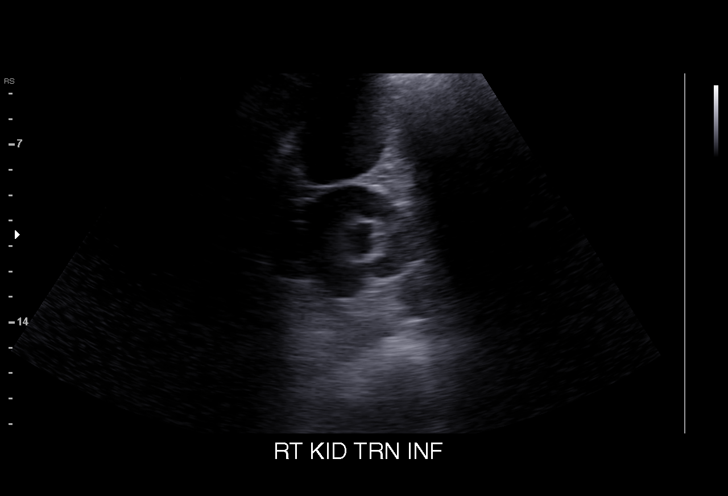
[im 16/37]
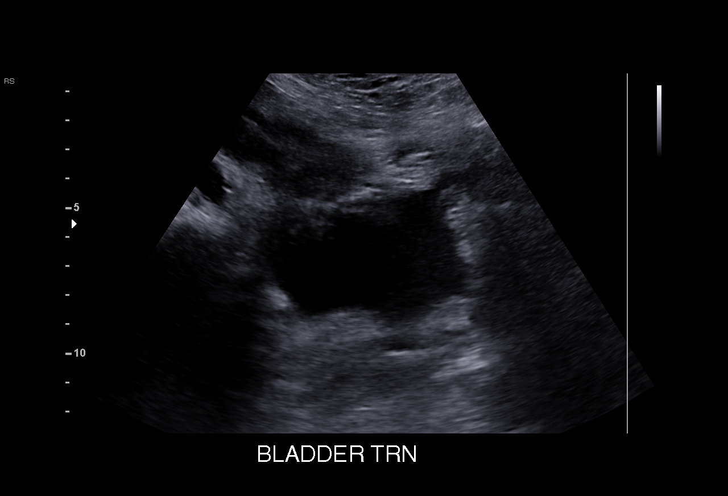
[im 19/37]
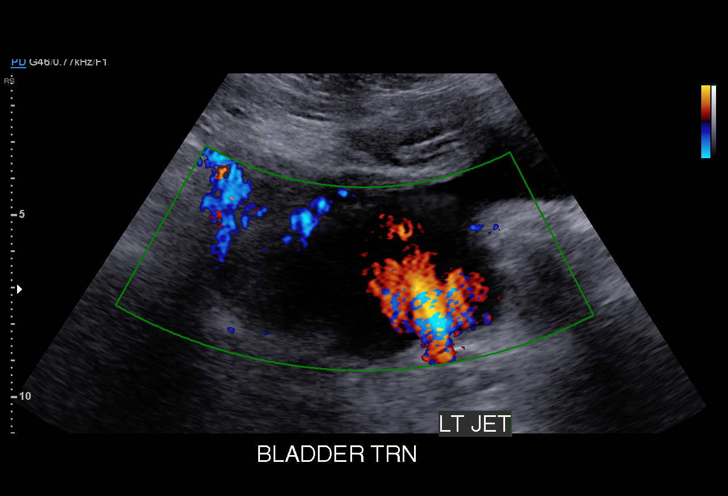
[im 22/37]
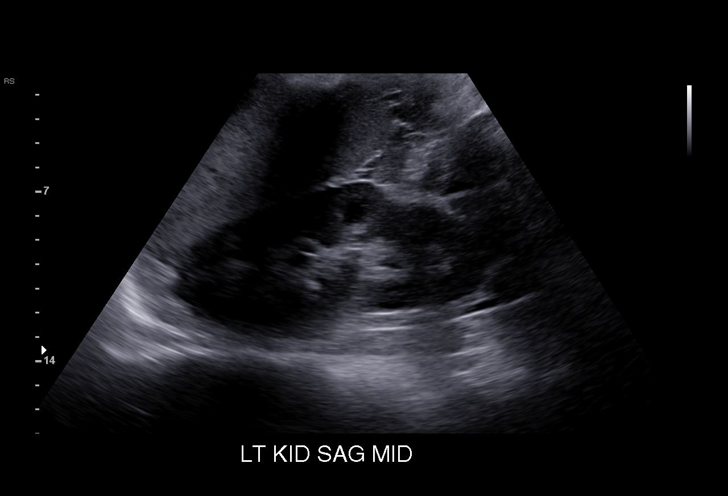
[im 23/37]
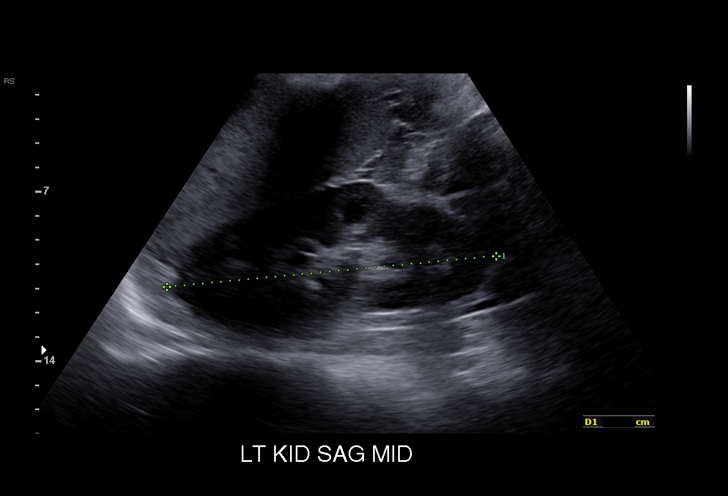
[im 26/37]
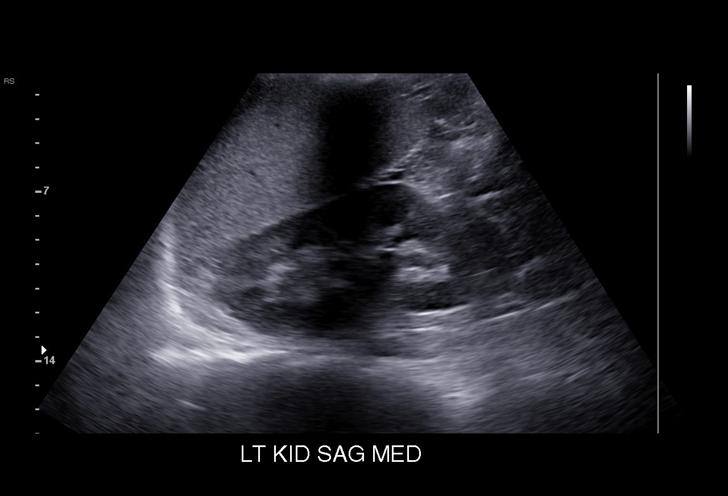
[im 29/37]
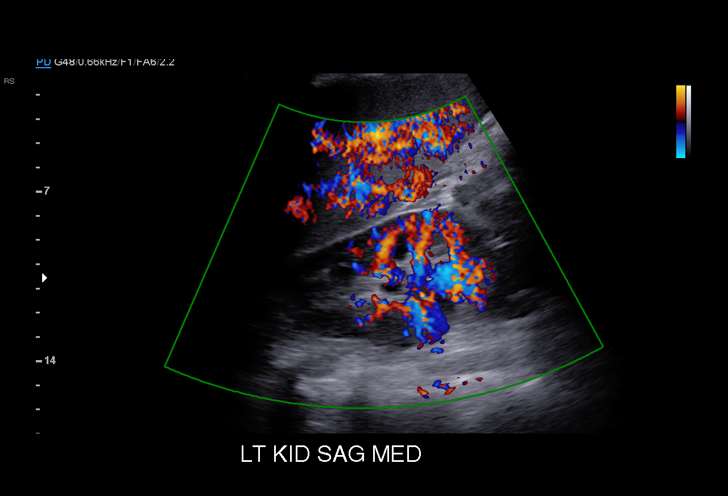
[im 31/37]
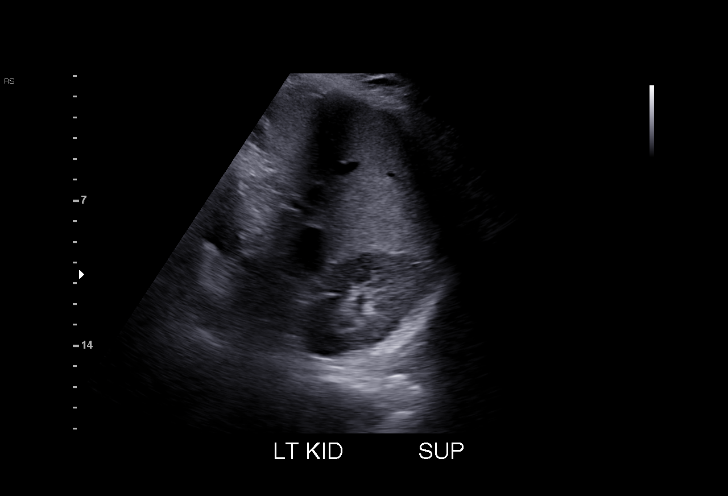
[im 34/37]
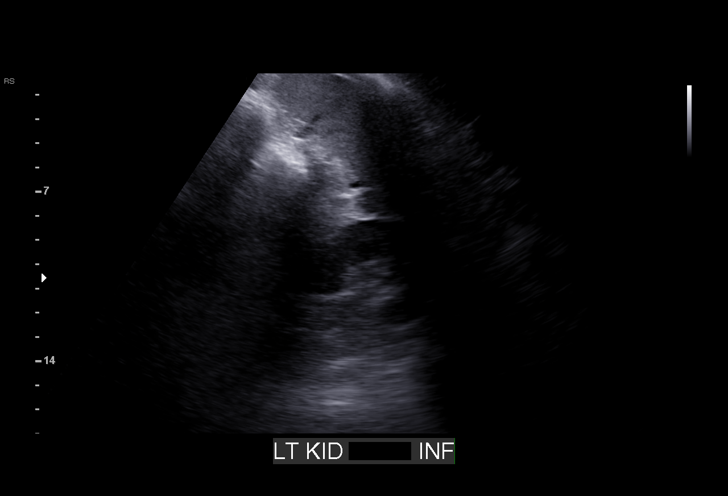
[im 37/37]
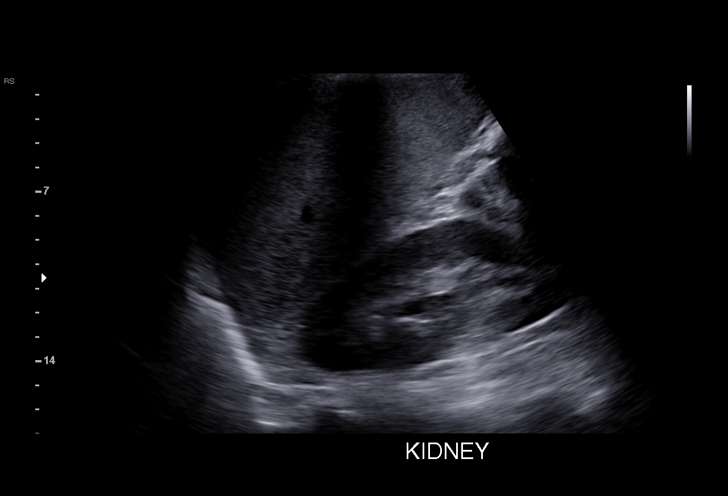

[15 of 25 positions shown; findings below may reference images not displayed]

FINDINGS: Right Kidney:

Length: 11.5 cm. Echogenicity within normal limits. Mild right
hydronephrosis.

Left Kidney:

Length: 13.6 cm. Echogenicity within normal limits. No mass or
hydronephrosis visualized.

Bladder:

Appears normal for degree of bladder distention. Bilateral ureteral
jets are identified.
IMPRESSION: 1. Mild right hydronephrosis.
2. Patent ureters bilaterally.
3. Normal left kidney.

## 2022-08-25 DIAGNOSIS — Z20822 Contact with and (suspected) exposure to covid-19: Secondary | ICD-10-CM | POA: Diagnosis not present

## 2022-08-25 DIAGNOSIS — R052 Subacute cough: Secondary | ICD-10-CM | POA: Diagnosis not present

## 2022-08-25 DIAGNOSIS — N912 Amenorrhea, unspecified: Secondary | ICD-10-CM | POA: Diagnosis not present

## 2022-08-25 DIAGNOSIS — N3001 Acute cystitis with hematuria: Secondary | ICD-10-CM | POA: Diagnosis not present

## 2022-09-29 DIAGNOSIS — L918 Other hypertrophic disorders of the skin: Secondary | ICD-10-CM | POA: Diagnosis not present

## 2022-11-09 ENCOUNTER — Ambulatory Visit: Payer: Medicaid Other | Admitting: Dermatology

## 2022-11-09 VITALS — BP 136/91

## 2022-11-09 DIAGNOSIS — L578 Other skin changes due to chronic exposure to nonionizing radiation: Secondary | ICD-10-CM

## 2022-11-09 DIAGNOSIS — D492 Neoplasm of unspecified behavior of bone, soft tissue, and skin: Secondary | ICD-10-CM | POA: Diagnosis not present

## 2022-11-09 DIAGNOSIS — W908XXA Exposure to other nonionizing radiation, initial encounter: Secondary | ICD-10-CM

## 2022-11-09 DIAGNOSIS — D2271 Melanocytic nevi of right lower limb, including hip: Secondary | ICD-10-CM

## 2022-11-09 DIAGNOSIS — D1801 Hemangioma of skin and subcutaneous tissue: Secondary | ICD-10-CM | POA: Diagnosis not present

## 2022-11-09 DIAGNOSIS — Z1283 Encounter for screening for malignant neoplasm of skin: Secondary | ICD-10-CM

## 2022-11-09 DIAGNOSIS — L814 Other melanin hyperpigmentation: Secondary | ICD-10-CM | POA: Diagnosis not present

## 2022-11-09 DIAGNOSIS — D485 Neoplasm of uncertain behavior of skin: Secondary | ICD-10-CM

## 2022-11-09 DIAGNOSIS — L910 Hypertrophic scar: Secondary | ICD-10-CM | POA: Diagnosis not present

## 2022-11-09 DIAGNOSIS — D229 Melanocytic nevi, unspecified: Secondary | ICD-10-CM

## 2022-11-09 DIAGNOSIS — L821 Other seborrheic keratosis: Secondary | ICD-10-CM | POA: Diagnosis not present

## 2022-11-09 DIAGNOSIS — Z808 Family history of malignant neoplasm of other organs or systems: Secondary | ICD-10-CM | POA: Diagnosis not present

## 2022-11-09 MED ORDER — TRIAMCINOLONE ACETONIDE 40 MG/ML IJ SUSP
40.0000 mg | Freq: Once | INTRAMUSCULAR | Status: AC
  Administered 2022-11-09: 40 mg via INTRADERMAL

## 2022-11-09 MED ORDER — TRIAMCINOLONE ACETONIDE 40 MG/ML IJ SUSP: 40.0000 mg | Freq: Once | INTRAMUSCULAR | Status: DC

## 2022-11-09 NOTE — Progress Notes (Unsigned)
New Patient Visit   Subjective  Alexandria Carr is a 42 y.o. female who presents for the following: Skin Cancer Screening and Full Body Skin Exam - No history of skin cancer. Family history of BCC and possible family history of Melanoma in maternal grandfather  The patient presents for Total-Body Skin Exam (TBSE) for skin cancer screening and mole check. The patient has spots, moles and lesions to be evaluated, some may be new or changing.  The following portions of the chart were reviewed this encounter and updated as appropriate: medications, allergies, medical history  Review of Systems:  No other skin or systemic complaints except as noted in HPI or Assessment and Plan.  Objective  Well appearing patient in no apparent distress; mood and affect are within normal limits.  A full examination was performed including scalp, head, eyes, ears, nose, lips, neck, chest, axillae, abdomen, back, buttocks, bilateral upper extremities, bilateral lower extremities, hands, feet, fingers, toes, fingernails, and toenails. All findings within normal limits unless otherwise noted below.   Relevant physical exam findings are noted in the Assessment and Plan.  Left Shoulder - Posterior Keloid scar  Right Thigh - Anterior 1.2 x 0.8 flesh colored papule       Left Shoulder Slightly irregular brown macule         Assessment & Plan   SKIN CANCER SCREENING PERFORMED TODAY.  ACTINIC DAMAGE - Chronic condition, secondary to cumulative UV/sun exposure - diffuse scaly erythematous macules with underlying dyspigmentation - Recommend daily broad spectrum sunscreen SPF 30+ to sun-exposed areas, reapply every 2 hours as needed.  - Staying in the shade or wearing long sleeves, sun glasses (UVA+UVB protection) and wide brim hats (4-inch brim around the entire circumference of the hat) are also recommended for sun protection.  - Call for new or changing lesions.  LENTIGINES, SEBORRHEIC  KERATOSES, HEMANGIOMAS - Benign normal skin lesions - Benign-appearing - Call for any changes  MELANOCYTIC NEVI - Tan-brown and/or pink-flesh-colored symmetric macules and papules - Benign appearing on exam today - Observation - Call clinic for new or changing moles - Recommend daily use of broad spectrum spf 30+ sunscreen to sun-exposed areas.   Keloid Left Shoulder - Posterior  Intralesional injection - Left Shoulder - Posterior Location: left post shoulder  Informed Consent: Discussed risks (infection, pain, bleeding, bruising, thinning of the skin, loss of skin pigment, lack of resolution, and recurrence of lesion) and benefits of the procedure, as well as the alternatives. Informed consent was obtained. Preparation: The area was prepared a standard fashion.  Anesthesia:none  Procedure Details: An intralesional injection was performed with Kenalog 40 mg/cc. 1.0 cc in total were injected.  Total number of injections: 3  Plan: The patient was instructed on post-op care. Recommend OTC analgesia as needed for pain.   Related Medications triamcinolone acetonide (KENALOG-40) injection 40 mg   Neoplasm of uncertain behavior of skin Right Thigh - Anterior  Skin / nail biopsy Type of biopsy: tangential   Informed consent: discussed and consent obtained   Timeout: patient name, date of birth, surgical site, and procedure verified   Procedure prep:  Patient was prepped and draped in usual sterile fashion Prep type:  Isopropyl alcohol Anesthesia: the lesion was anesthetized in a standard fashion   Anesthetic:  1% lidocaine w/ epinephrine 1-100,000 buffered w/ 8.4% NaHCO3 Instrument used: flexible razor blade   Hemostasis achieved with: pressure, aluminum chloride and electrodesiccation   Outcome: patient tolerated procedure well   Post-procedure details: sterile  dressing applied and wound care instructions given   Dressing type: bandage and petrolatum    Specimen 1 -  Surgical pathology Differential Diagnosis: Irritated nevus R/O dysplasia Check Margins: No  Pigmented Patch- Ddx Nevus vs other Left Shoulder Discussed option of biopsy vs monitoring, patient opts to monitor and recheck Recheck on follow up  Return for 4-6 weeks keloid follow up, recheck nevus.  I, Joanie Coddington, CMA, am acting as scribe for Gwenith Daily, MD .   Documentation: I have reviewed the above documentation for accuracy and completeness, and I agree with the above.  Gwenith Daily, MD

## 2022-11-09 NOTE — Patient Instructions (Signed)

## 2022-11-10 ENCOUNTER — Encounter: Payer: Self-pay | Admitting: Dermatology

## 2022-11-11 LAB — SURGICAL PATHOLOGY

## 2022-11-15 DIAGNOSIS — Z9851 Tubal ligation status: Secondary | ICD-10-CM | POA: Diagnosis not present

## 2022-11-15 DIAGNOSIS — Z0000 Encounter for general adult medical examination without abnormal findings: Secondary | ICD-10-CM | POA: Diagnosis not present

## 2022-11-15 DIAGNOSIS — Z113 Encounter for screening for infections with a predominantly sexual mode of transmission: Secondary | ICD-10-CM | POA: Diagnosis not present

## 2022-11-15 DIAGNOSIS — R102 Pelvic and perineal pain: Secondary | ICD-10-CM | POA: Diagnosis not present

## 2022-11-15 DIAGNOSIS — N920 Excessive and frequent menstruation with regular cycle: Secondary | ICD-10-CM | POA: Diagnosis not present

## 2022-12-07 ENCOUNTER — Encounter: Payer: Self-pay | Admitting: Dermatology

## 2022-12-07 ENCOUNTER — Ambulatory Visit: Payer: Medicaid Other | Admitting: Dermatology

## 2022-12-07 VITALS — BP 130/83 | HR 81

## 2022-12-07 DIAGNOSIS — D492 Neoplasm of unspecified behavior of bone, soft tissue, and skin: Secondary | ICD-10-CM | POA: Diagnosis not present

## 2022-12-07 DIAGNOSIS — L910 Hypertrophic scar: Secondary | ICD-10-CM

## 2022-12-07 NOTE — Patient Instructions (Signed)

## 2022-12-07 NOTE — Progress Notes (Signed)
   Follow-Up Visit   Subjective  Alexandria Carr is a 42 y.o. female who presents for the following: keloid on left posterior shoulder Pt here today for follow up from keloid injections on left posterior shoulder. Pt states it is much smaller but still itchy.    The following portions of the chart were reviewed this encounter and updated as appropriate: medications, allergies, medical history  Review of Systems:  No other skin or systemic complaints except as noted in HPI or Assessment and Plan.  Objective  Well appearing patient in no apparent distress; mood and affect are within normal limits.  A focused examination was performed of the following areas: Left posterior shoulder  Relevant exam findings are noted in the Assessment and Plan.      Assessment & Plan    Neoplasm of Skin- Firm pink plaque within scar on left upper back- Ddx Keloid vs Hypertrophic scar vs NMSC vs other- not at treatment goal Exam: firm nodule  Treatment Plan: - s/p 1 round of ILK 40 with limited response - Discussed repeat ILK vs excisional biopsy with perpetual ILK during and post op; patient prefers to schedule excision   Return in 9 days (on 12/16/2022) for 9:45 excision .  I, Tillie Fantasia, CMA, am acting as scribe for Gwenith Daily, MD.   Documentation: I have reviewed the above documentation for accuracy and completeness, and I agree with the above.  Gwenith Daily, MD

## 2022-12-09 DIAGNOSIS — N920 Excessive and frequent menstruation with regular cycle: Secondary | ICD-10-CM | POA: Diagnosis not present

## 2022-12-09 DIAGNOSIS — Z1231 Encounter for screening mammogram for malignant neoplasm of breast: Secondary | ICD-10-CM | POA: Diagnosis not present

## 2022-12-09 DIAGNOSIS — R102 Pelvic and perineal pain: Secondary | ICD-10-CM | POA: Diagnosis not present

## 2022-12-16 ENCOUNTER — Ambulatory Visit: Payer: Medicaid Other | Admitting: Dermatology

## 2022-12-16 ENCOUNTER — Encounter: Payer: Self-pay | Admitting: Dermatology

## 2022-12-16 VITALS — BP 125/87 | HR 80

## 2022-12-16 DIAGNOSIS — L910 Hypertrophic scar: Secondary | ICD-10-CM | POA: Diagnosis not present

## 2022-12-16 DIAGNOSIS — D492 Neoplasm of unspecified behavior of bone, soft tissue, and skin: Secondary | ICD-10-CM | POA: Diagnosis not present

## 2022-12-16 DIAGNOSIS — D235 Other benign neoplasm of skin of trunk: Secondary | ICD-10-CM

## 2022-12-16 DIAGNOSIS — D485 Neoplasm of uncertain behavior of skin: Secondary | ICD-10-CM

## 2022-12-16 MED ORDER — TRIAMCINOLONE ACETONIDE 40 MG/ML IJ SUSP
40.0000 mg | Freq: Once | INTRAMUSCULAR | Status: AC
  Administered 2022-12-16: 40 mg via INTRAMUSCULAR

## 2022-12-16 NOTE — Progress Notes (Addendum)
-  Follow-Up Visit   Subjective  Alexandria Carr is a 42 y.o. female who presents for the following: Excision of a neoplasm of skin on the left upper back. Previously removed lesion by outside provider, resulted in likely keloid.   The following portions of the chart were reviewed this encounter and updated as appropriate: medications, allergies, medical history  Review of Systems:  No other skin or systemic complaints except as noted in HPI or Assessment and Plan.  Objective  Well appearing patient in no apparent distress; mood and affect are within normal limits.  A focused examination was performed of the following areas:  Right thigh  Relevant physical exam findings are noted in the Assessment and Plan.   Left Upper Back Firm nodule         Assessment & Plan   Neoplasm of uncertain behavior of skin Left Upper Back  Skin excision  Lesion length (cm):  1.2 Lesion width (cm):  0.8 Margin per side (cm):  0.1 Total excision diameter (cm):  1.4 Informed consent: discussed and consent obtained   Timeout: patient name, date of birth, surgical site, and procedure verified   Procedure prep:  Patient was prepped and draped in usual sterile fashion Prep type:  Chlorhexidine Anesthesia: the lesion was anesthetized in a standard fashion   Anesthetic:  1% lidocaine w/ epinephrine 1-100,000 local infiltration Instrument used: #15 blade   Hemostasis achieved with: suture   Hemostasis achieved with comment:  3.0 PDS, 4.0 prolene Outcome: patient tolerated procedure well with no complications   Post-procedure details: wound care instructions given   Additional details:  3.2 final length  Intralesional injection Procedure Note Intralesional Injection  Location: left upper back  Informed Consent: Discussed risks (infection, pain, bleeding, bruising, thinning of the skin, loss of skin pigment, lack of resolution, and recurrence of lesion) and benefits of the procedure, as  well as the alternatives. Informed consent was obtained. Preparation: The area was prepared a standard fashion.  Anesthesia:N/A  Procedure Details: An intralesional injection was performed with Kenalog 20 mg/cc. 1.0 cc in total were injected. NDC #: 4540-9811-91 Exp: 06/23/2024  Total number of injections: 1  Plan: The patient was instructed on post-op care. Recommend OTC analgesia as needed for pain.   Skin repair Complexity:  Intermediate Final length (cm):  3.2 Informed consent: discussed and consent obtained   Timeout: patient name, date of birth, surgical site, and procedure verified   Procedure prep:  Patient was prepped and draped in usual sterile fashion Prep type:  Chlorhexidine Anesthesia: the lesion was anesthetized in a standard fashion   Anesthetic:  1% lidocaine w/ epinephrine 1-100,000 local infiltration Reason for type of repair: reduce tension to allow closure, reduce the risk of dehiscence, infection, and necrosis, allow closure of the large defect, preserve normal anatomy, allow side-to-side closure without requiring a flap or graft and compensate for the inelasticity of skin in this area   Undermining: edges undermined   Subcutaneous layers (deep stitches):  Suture size:  3-0 Suture type: PDS (polydioxanone)   Stitches:  Buried vertical mattress Fine/surface layer approximation (top stitches):  Suture size:  4-0 Suture type: Prolene (polypropylene)   Stitches: simple running   Suture removal (days):  7 Hemostasis achieved with: suture and pressure Outcome: patient tolerated procedure well with no complications   Post-procedure details: sterile dressing applied and wound care instructions given   Dressing type: pressure dressing and bacitracin    Specimen 1 - Surgical pathology Differential Diagnosis: R/O  keloid vs hypertrophic scar vs NMSC  Check Margins: No  Keloid  Related Medications triamcinolone acetonide (KENALOG-40) injection 40 mg   ILK was  performed at the excision borders and within the wound to prevent recurrence of keloid.  Return in about 2 weeks (around 12/30/2022) for wound check.  I, Tillie Fantasia, CMA, am acting as scribe for Gwenith Daily, MD.   Documentation: I have reviewed the above documentation for accuracy and completeness, and I agree with the above.  Gwenith Daily, MD

## 2022-12-16 NOTE — Patient Instructions (Signed)

## 2022-12-21 ENCOUNTER — Telehealth: Payer: Self-pay

## 2022-12-21 LAB — SURGICAL PATHOLOGY

## 2022-12-21 NOTE — Telephone Encounter (Signed)
Called pt and went over results that the spot was a dermatofibroma and no further treatment was needed.

## 2022-12-22 ENCOUNTER — Ambulatory Visit: Payer: Medicaid Other | Admitting: Dermatology

## 2022-12-22 DIAGNOSIS — D239 Other benign neoplasm of skin, unspecified: Secondary | ICD-10-CM

## 2022-12-22 DIAGNOSIS — D235 Other benign neoplasm of skin of trunk: Secondary | ICD-10-CM

## 2022-12-22 DIAGNOSIS — L905 Scar conditions and fibrosis of skin: Secondary | ICD-10-CM

## 2022-12-22 DIAGNOSIS — L910 Hypertrophic scar: Secondary | ICD-10-CM

## 2022-12-22 NOTE — Progress Notes (Signed)
   Follow-Up Visit   Subjective  Alexandria Carr is a 42 y.o. female who presents for the following: Suture removal and discussion of biopsy results.  Pathology showed dermatofibroma. Pt states she had no problems with spot. She has been having itching and have been bandaging the wound daily with ointment. He has had some itching.  The following portions of the chart were reviewed this encounter and updated as appropriate: medications, allergies, medical history  Review of Systems:  No other skin or systemic complaints except as noted in HPI or Assessment and Plan.  Objective  Well appearing patient in no apparent distress; mood and affect are within normal limits.  Areas Examined: Left upper back  Relevant physical exam findings are noted in the Assessment and Plan.      Assessment & Plan    Scar s/p WLE for NOS, treated on 12/16/22, repaired with linear closure - Reassured that wound is healing well - Discussed that scars take up to 12 months to mature from the date of surgery - Recommend SPF 30+ to scar daily to prevent purple color - OK to start scar massage at 4-6 weeks post-op - Can consider silicone based products for scar healing  Previous history of Keloid- left upper back - s/p ILK 40 with limited response, then ILK 20 during excision, will repeat q4 weeks to prevent recurrence  Dermatofibroma- left upper back - Discussed that lesion was a benign scar tissue formation   Return in 3 weeks (on 01/12/2023) for as scheduled for follow up ILK.  KARINA M PACI

## 2022-12-29 DIAGNOSIS — N8003 Adenomyosis of the uterus: Secondary | ICD-10-CM | POA: Diagnosis not present

## 2023-01-11 DIAGNOSIS — G8929 Other chronic pain: Secondary | ICD-10-CM | POA: Diagnosis not present

## 2023-01-11 DIAGNOSIS — Z9851 Tubal ligation status: Secondary | ICD-10-CM | POA: Diagnosis not present

## 2023-01-11 DIAGNOSIS — Z98891 History of uterine scar from previous surgery: Secondary | ICD-10-CM | POA: Diagnosis not present

## 2023-01-11 DIAGNOSIS — N920 Excessive and frequent menstruation with regular cycle: Secondary | ICD-10-CM | POA: Diagnosis not present

## 2023-01-11 DIAGNOSIS — N8003 Adenomyosis of the uterus: Secondary | ICD-10-CM | POA: Diagnosis not present

## 2023-01-11 DIAGNOSIS — R1031 Right lower quadrant pain: Secondary | ICD-10-CM | POA: Diagnosis not present

## 2023-01-12 ENCOUNTER — Encounter: Payer: Self-pay | Admitting: Dermatology

## 2023-01-12 ENCOUNTER — Ambulatory Visit (INDEPENDENT_AMBULATORY_CARE_PROVIDER_SITE_OTHER): Payer: Medicaid Other | Admitting: Dermatology

## 2023-01-12 DIAGNOSIS — L910 Hypertrophic scar: Secondary | ICD-10-CM

## 2023-01-12 DIAGNOSIS — L905 Scar conditions and fibrosis of skin: Secondary | ICD-10-CM | POA: Diagnosis not present

## 2023-01-12 NOTE — Patient Instructions (Signed)

## 2023-01-12 NOTE — Progress Notes (Signed)
   Follow-Up Visit   Subjective  Alexandria Carr is a 42 y.o. female who presents for the following: keloid injection  Pt being treated for prevention of keloid from previously excised DF on upper back. Patient had ILK 40 injected after the excision at the last visit. She says she is no longer having itching.   The following portions of the chart were reviewed this encounter and updated as appropriate: medications, allergies, medical history  Review of Systems:  No other skin or systemic complaints except as noted in HPI or Assessment and Plan.  Objective  Well appearing patient in no apparent distress; mood and affect are within normal limits.  A focused examination was performed of the following areas: Upper back  Relevant exam findings are noted in the Assessment and Plan.    Assessment & Plan   HISTORY OF KELOID- s/p DF excision years ago- left upper back Exam shows Firm pink/brown dermal papule(s)/plaque(s).  Lesion re-excised and treated with ILK 40 at last visit Will repeat ILK 5 today to prevent recurrence of keloid  Informed Consent: Discussed risks (infection, pain, bleeding, bruising, thinning of the skin, loss of skin pigment, lack of resolution, and recurrence of lesion) and benefits of the procedure, as well as the alternatives. Informed consent was obtained. Preparation: The area was prepared a standard fashion.  Procedure Details: An intralesional injection was performed with Kenalog 5 mg/cc. 0.5 cc in total were injected.  Total number of injections: 3  Plan: The patient was instructed on post-op care. Recommend OTC analgesia as needed for pain.    Return in about 4 weeks (around 02/09/2023).  I, Tillie Fantasia, CMA, am acting as scribe for Gwenith Daily, MD.   Documentation: I have reviewed the above documentation for accuracy and completeness, and I agree with the above.  Gwenith Daily, MD

## 2023-01-13 ENCOUNTER — Ambulatory Visit: Payer: Medicaid Other | Admitting: Dermatology

## 2023-02-01 MED ORDER — TRIAMCINOLONE ACETONIDE 10 MG/ML IJ SUSP: 5.0000 mg | Freq: Once | INTRAMUSCULAR | Status: AC

## 2023-02-10 ENCOUNTER — Encounter: Payer: Self-pay | Admitting: Dermatology

## 2023-02-10 ENCOUNTER — Ambulatory Visit: Payer: Medicaid Other | Admitting: Dermatology

## 2023-02-10 VITALS — BP 132/80 | HR 87

## 2023-02-10 DIAGNOSIS — Z872 Personal history of diseases of the skin and subcutaneous tissue: Secondary | ICD-10-CM

## 2023-02-10 DIAGNOSIS — Z86018 Personal history of other benign neoplasm: Secondary | ICD-10-CM

## 2023-02-10 DIAGNOSIS — L719 Rosacea, unspecified: Secondary | ICD-10-CM

## 2023-02-10 DIAGNOSIS — D239 Other benign neoplasm of skin, unspecified: Secondary | ICD-10-CM

## 2023-02-10 DIAGNOSIS — L905 Scar conditions and fibrosis of skin: Secondary | ICD-10-CM | POA: Diagnosis not present

## 2023-02-10 DIAGNOSIS — L910 Hypertrophic scar: Secondary | ICD-10-CM

## 2023-02-10 NOTE — Patient Instructions (Addendum)
PDL laser for rosacea      Post-Operative Scar Care: Education and Recommendations  Following your procedure, it's important to care for your scar to promote optimal healing and minimize its appearance. Proper post-operative care can help ensure that the scar heals well, and with time, it may become less noticeable. Below are key recommendations for scar care, including scar massage and the use of silicone scar gels or sheets.  1. General Scar Care Tips: -  Keep the wound clean and dry: Follow your healthcare provider's instructions for wound care, including cleaning the site and changing dressings as needed. -  Avoid sun exposure: Direct sunlight can darken scars and make them more noticeable. Once your wound has healed, apply sunscreen (SPF 30 or higher) to protect the scar from UV rays.  2. Scar Massage: - Start after healing: Wait until the scar has fully healed, with no scabs or open areas (usually 4-6 weeks after surgery). Your healthcare provider will give you specific guidance on when to begin. - Technique: Gently massage the scar in a circular motion for 5-10 minutes, 2-3 times per day. This helps to soften the tissue, reduce swelling, and improve the overall appearance of the scar. - Pressure: Apply gentle, firm pressure during the massage to break down the dense tissue that may form during healing. This helps to prevent the formation of keloids or hypertrophic scars. - Use lotion or ointment: Consider using a mild, fragrance-free lotion or vitamin E ointment to help lubricate the area during massage.  3. Silicone Scar Gels or Sheets: - When to start: Once your wound has healed completely, typically around 4-6 weeks, you can begin using silicone-based scar gels or sheets. These have been shown to improve scar appearance by hydrating the tissue and reducing inflammation. - How to use silicone gels: Apply a thin layer of the gel to the scar and allow it to dry before covering with  clothing. You can use the gel multiple times a day, depending on your provider's recommendation. - How to use silicone sheets: Cut the sheet to fit the size of your scar, and apply it directly to the healed scar. Wear it for 12-24 hours a day, and replace the sheet every few days as directed. - Benefits: Silicone helps reduce redness, flatten the scar, and improve its texture. Continued use over several months can lead to significant improvement in the appearance of the scar.  4. What to Expect: - Healing process: Scars generally take time to mature. The first few months may show redness or swelling, but this usually improves as healing progresses. - Long-term care: Scarring is a natural part of the healing process. While you cannot completely eliminate a scar, proper care can significantly improve its appearance over time. - Patience: It can take up to a year for a scar to fully mature, so it's important to be consistent with scar care and follow-up appointments with your provider.  5. When to Contact Your Healthcare Provider: - If you notice signs of infection (increased redness, warmth, drainage, or pain). - If your scar becomes unusually raised, itchy, or changes in color significantly. - If you have concerns about the appearance of your scar or experience unusual symptoms. - By following these guidelines, you can support your body's natural healing process and help ensure the best possible outcome for your scar. If you have any questions or concerns, please don't hesitate to contact our office.    Important Information   Due to recent changes in  healthcare laws, you may see results of your pathology and/or laboratory studies on MyChart before the doctors have had a chance to review them. We understand that in some cases there may be results that are confusing or concerning to you. Please understand that not all results are received at the same time and often the doctors may need to interpret  multiple results in order to provide you with the best plan of care or course of treatment. Therefore, we ask that you please give [place] 2 business days to thoroughly review all your results before contacting the office for clarification. Should we see a critical lab result, you will be contacted sooner.     If You Need Anything After Your Visit   If you have any questions or concerns for your doctor, please call our main line at [phone]. If no one answers, please leave a voicemail as directed and we will return your call as soon as possible. Messages left after 4 pm will be answered the following business day.    You may also send [place] a message via MyChart. We typically respond to MyChart messages within 1-2 business days.  For prescription refills, please ask your pharmacy to contact our office. Our fax number is [phone].  If you have an urgent issue when the clinic is closed that cannot wait until the next business day, you can page your doctor at the number below.     Please note that while we do our best to be available for urgent issues outside of office hours, we are not available 24/7.    If you have an urgent issue and are unable to reach [place], you may choose to seek medical care at your doctor's office, retail clinic, urgent care center, or emergency room.   If you have a medical emergency, please immediately call 911 or go to the emergency department. In the event of inclement weather, please call our main line at [phone] for an update on the status of any delays or closures.  Dermatology Medication Tips: Please keep the boxes that topical medications come in in order to help keep track of the instructions about where and how to use these. Pharmacies typically print the medication instructions only on the boxes and not directly on the medication tubes.   If your medication is too expensive, please contact our office at [phone] or send [place] a message through MyChart.     We are unable to tell what your co-pay for medications will be in advance as this is different depending on your insurance coverage. However, we may be able to find a substitute medication at lower cost or fill out paperwork to get insurance to cover a needed medication.    If a prior authorization is required to get your medication covered by your insurance company, please allow [place] 1-2 business days to complete this process.   Drug prices often vary depending on where the prescription is filled and some pharmacies may offer cheaper prices.   The website www.goodrx.com contains coupons for medications through different pharmacies. The prices here do not account for what the cost may be with help from insurance (it may be cheaper with your insurance), but the website can give you the price if you did not use any insurance.  - You can print the associated coupon and take it with your prescription to the pharmacy.  - You may also stop by our office during regular business hours and pick up a GoodRx coupon  card.  - If you need your prescription sent electronically to a different pharmacy, notify our office through [facility] or by phone at [phone]

## 2023-02-10 NOTE — Progress Notes (Signed)
   Follow-Up Visit   Subjective  Alexandria Carr is a 43 y.o. female who presents for the following: keloid injection Pt being treated for prevention of keloid from previously excised DF on upper back. Patient had ILK 40 injected after the excision and 1 subsequent injection. She says she is not having any troubles with spot.  She also mentions redness of her face, present for years, not previously treated, unknown triggers  The following portions of the chart were reviewed this encounter and updated as appropriate: medications, allergies, medical history  Review of Systems:  No other skin or systemic complaints except as noted in HPI or Assessment and Plan.  Objective  Well appearing patient in no apparent distress; mood and affect are within normal limits.  A focused examination was performed of the following areas: Upper back  Relevant exam findings are noted in the Assessment and Plan.    Assessment & Plan   Scar s/p WLE for DF/Scar, treated on , repaired with linear closure - Reassured that wound has healed well - Discussed that scars take up to 12 months to mature from the date of surgery - Recommend SPF 30+ to scar daily to prevent purple color - OK to start scar massage at 4-6 weeks post-op - Can consider silicone based products for scar healing  HISTORY OF KELOID- s/p DF excision years ago- left upper back Exam shows Firm pink/brown dermal papule(s)/plaque(s).  Lesion re-excised and treated with ILK 40 at last visit S/p IL5 at last visit No need for ILK today, will reassess  ROSACEA Exam Mid face erythema with telangiectasias  flared  Rosacea is a chronic progressive skin condition usually affecting the face of adults, causing redness and/or acne bumps. It is treatable but not curable. It sometimes affects the eyes (ocular rosacea) as well. It may respond to topical and/or systemic medication and can flare with stress, sun exposure, alcohol, exercise, topical  steroids (including hydrocortisone/cortisone 10) and some foods.  Daily application of broad spectrum spf 30+ sunscreen to face is recommended to reduce flares.  Discussed PDL laser for telangiectasias   No follow-ups on file.  I, Tillie Fantasia, CMA, am acting as scribe for Gwenith Daily, MD.   Documentation: I have reviewed the above documentation for accuracy and completeness, and I agree with the above.  Gwenith Daily, MD

## 2023-02-23 ENCOUNTER — Ambulatory Visit: Payer: Commercial Managed Care - HMO | Admitting: Dermatology

## 2023-03-21 DIAGNOSIS — E039 Hypothyroidism, unspecified: Secondary | ICD-10-CM | POA: Diagnosis not present

## 2023-03-21 DIAGNOSIS — D509 Iron deficiency anemia, unspecified: Secondary | ICD-10-CM | POA: Diagnosis not present

## 2023-03-25 DIAGNOSIS — N920 Excessive and frequent menstruation with regular cycle: Secondary | ICD-10-CM | POA: Diagnosis not present

## 2023-03-25 DIAGNOSIS — Z90711 Acquired absence of uterus with remaining cervical stump: Secondary | ICD-10-CM

## 2023-03-25 DIAGNOSIS — N8003 Adenomyosis of the uterus: Secondary | ICD-10-CM | POA: Diagnosis not present

## 2023-03-25 HISTORY — DX: Acquired absence of uterus with remaining cervical stump: Z90.711

## 2023-05-11 ENCOUNTER — Encounter: Payer: Self-pay | Admitting: Dermatology

## 2023-05-11 ENCOUNTER — Ambulatory Visit (INDEPENDENT_AMBULATORY_CARE_PROVIDER_SITE_OTHER): Payer: Medicaid Other | Admitting: Dermatology

## 2023-05-11 VITALS — BP 131/80 | HR 79

## 2023-05-11 DIAGNOSIS — Z86018 Personal history of other benign neoplasm: Secondary | ICD-10-CM | POA: Diagnosis not present

## 2023-05-11 DIAGNOSIS — L539 Erythematous condition, unspecified: Secondary | ICD-10-CM

## 2023-05-11 DIAGNOSIS — D239 Other benign neoplasm of skin, unspecified: Secondary | ICD-10-CM

## 2023-05-11 DIAGNOSIS — L259 Unspecified contact dermatitis, unspecified cause: Secondary | ICD-10-CM

## 2023-05-11 DIAGNOSIS — R21 Rash and other nonspecific skin eruption: Secondary | ICD-10-CM

## 2023-05-11 DIAGNOSIS — L905 Scar conditions and fibrosis of skin: Secondary | ICD-10-CM

## 2023-05-11 NOTE — Progress Notes (Signed)
   Follow Up Visit   Subjective  Alexandria Carr is a 43 y.o. 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 The patient has been bandaging the wound as directed. The endorse the following concerns: none  She also mentions a rash on her lower abdomen after her hysterectomy procedure which was intensely itchy and ended up needing oral steroids to help with the reaction. She has photos of the rash.   The following portions of the chart were reviewed this encounter and updated as appropriate: medications, allergies, medical history  Review of Systems:  No other skin or systemic complaints except as noted in HPI or Assessment and Plan.  Objective  Well appearing patient in no apparent distress; mood and affect are within normal limits.  A full examination was performed including scalp, head, face and left upper back. All findings within normal limits unless otherwise noted below.  Healing wound with mild erythema  Relevant physical exam findings are noted in the Assessment and Plan.    Assessment & Plan    Healing s/p excision for dermatofibroma, treated on 12/16/22, repaired with linear closure - Reassured that wound is healing well - No evidence of infection - No keloid formation - Discussed that scars take up to 12 months to mature from the date of surgery - Recommend SPF 30+ to scar daily to prevent purple color from UV exposure during scar maturation process - Discussed that erythema and raised appearance of scar will fade over the next 4-6 months - OK to start scar massage at 4-6 weeks post-op - Can consider silicone based products for scar healing starting at 6 weeks post-op  Likely Contact Dermatitis vs Suture Response-  Lower abdomen The patient was counseled regarding a focal vesicular, erythematous eruption noted  around all three laparoscopy entry sites following her recent gynecologic surgery. The rash is likely a localized allergic reaction, which could be due to several factors, including a reaction to the surgical scrub used during the procedure, an adhesive allergy from the bandages applied postoperatively, or an allergy to the sutures used to close the laparoscopy sites. The patient was advised to inquire with the surgical team about the specific products used during and after the procedure, including the type of surgical scrub, adhesive, and sutures. This information will help guide the identification of potential allergens, which can be documented in her medical chart for future reference and to prevent recurrence in subsequent surgeries. The patient was reassured that this reaction is typically self-limited, but if symptoms persist or worsen, she should follow up for further evaluation and management.  Return if symptoms worsen or fail to improve.  I, Wilson Hasten, CMA, am acting as scribe for Deneise Finlay, MD.   Documentation: I have reviewed the above documentation for accuracy and completeness, and I agree with the above.  Deneise Finlay, MD

## 2023-05-24 DIAGNOSIS — Z92241 Personal history of systemic steroid therapy: Secondary | ICD-10-CM | POA: Diagnosis not present

## 2023-05-24 DIAGNOSIS — Z90710 Acquired absence of both cervix and uterus: Secondary | ICD-10-CM | POA: Diagnosis not present

## 2023-05-27 ENCOUNTER — Encounter: Payer: Self-pay | Admitting: Dermatology

## 2023-10-19 ENCOUNTER — Emergency Department (HOSPITAL_COMMUNITY)

## 2023-10-19 ENCOUNTER — Emergency Department (HOSPITAL_COMMUNITY)
Admission: EM | Admit: 2023-10-19 | Discharge: 2023-10-20 | Disposition: A | Discharge status: Home / Self Care | Attending: Emergency Medicine | Admitting: Emergency Medicine

## 2023-10-19 ENCOUNTER — Encounter (HOSPITAL_COMMUNITY): Payer: Self-pay

## 2023-10-19 ENCOUNTER — Other Ambulatory Visit: Payer: Self-pay

## 2023-10-19 DIAGNOSIS — N83292 Other ovarian cyst, left side: Secondary | ICD-10-CM | POA: Insufficient documentation

## 2023-10-19 DIAGNOSIS — N200 Calculus of kidney: Secondary | ICD-10-CM | POA: Diagnosis not present

## 2023-10-19 DIAGNOSIS — N83201 Unspecified ovarian cyst, right side: Secondary | ICD-10-CM | POA: Diagnosis not present

## 2023-10-19 DIAGNOSIS — N83209 Unspecified ovarian cyst, unspecified side: Secondary | ICD-10-CM

## 2023-10-19 DIAGNOSIS — R109 Unspecified abdominal pain: Secondary | ICD-10-CM | POA: Diagnosis not present

## 2023-10-19 DIAGNOSIS — N838 Other noninflammatory disorders of ovary, fallopian tube and broad ligament: Secondary | ICD-10-CM | POA: Diagnosis not present

## 2023-10-19 LAB — URINALYSIS, ROUTINE W REFLEX MICROSCOPIC
Bilirubin Urine: NEGATIVE
Glucose, UA: NEGATIVE mg/dL
Ketones, ur: NEGATIVE mg/dL
Leukocytes,Ua: NEGATIVE
Nitrite: NEGATIVE
Protein, ur: NEGATIVE mg/dL
Specific Gravity, Urine: 1.006 (ref 1.005–1.030)
pH: 6 (ref 5.0–8.0)

## 2023-10-19 LAB — CBC WITH DIFFERENTIAL/PLATELET
Abs Immature Granulocytes: 0.05 K/uL (ref 0.00–0.07)
Basophils Absolute: 0.1 K/uL (ref 0.0–0.1)
Basophils Relative: 1 %
Eosinophils Absolute: 0 K/uL (ref 0.0–0.5)
Eosinophils Relative: 0 %
HCT: 44.9 % (ref 36.0–46.0)
Hemoglobin: 15.5 g/dL — ABNORMAL HIGH (ref 12.0–15.0)
Immature Granulocytes: 1 %
Lymphocytes Relative: 35 %
Lymphs Abs: 3.4 K/uL (ref 0.7–4.0)
MCH: 30.1 pg (ref 26.0–34.0)
MCHC: 34.5 g/dL (ref 30.0–36.0)
MCV: 87.2 fL (ref 80.0–100.0)
Monocytes Absolute: 0.7 K/uL (ref 0.1–1.0)
Monocytes Relative: 7 %
Neutro Abs: 5.7 K/uL (ref 1.7–7.7)
Neutrophils Relative %: 56 %
Platelets: 317 K/uL (ref 150–400)
RBC: 5.15 MIL/uL — ABNORMAL HIGH (ref 3.87–5.11)
RDW: 12.5 % (ref 11.5–15.5)
WBC: 10 K/uL (ref 4.0–10.5)
nRBC: 0 % (ref 0.0–0.2)

## 2023-10-19 LAB — HCG, QUANTITATIVE, PREGNANCY: hCG, Beta Chain, Quant, S: <1 m[IU]/mL (ref <5)

## 2023-10-19 LAB — COMPREHENSIVE METABOLIC PANEL WITH GFR
ALT: 37 U/L (ref 0–44)
AST: 26 U/L (ref 15–41)
Albumin: 4.3 g/dL (ref 3.5–5.0)
Alkaline Phosphatase: 89 U/L (ref 38–126)
Anion gap: 10 (ref 5–15)
BUN: 10 mg/dL (ref 6–20)
CO2: 23 mmol/L (ref 22–32)
Calcium: 9.3 mg/dL (ref 8.9–10.3)
Chloride: 105 mmol/L (ref 98–111)
Creatinine, Ser: 0.74 mg/dL (ref 0.44–1.00)
GFR, Estimated: >60 mL/min (ref >60)
Glucose, Bld: 87 mg/dL (ref 70–99)
Potassium: 3.6 mmol/L (ref 3.5–5.1)
Sodium: 138 mmol/L (ref 135–145)
Total Bilirubin: 0.8 mg/dL (ref 0.0–1.2)
Total Protein: 7.4 g/dL (ref 6.5–8.1)

## 2023-10-19 LAB — LIPASE, BLOOD: Lipase: 43 U/L (ref 11–51)

## 2023-10-19 MED ORDER — KETOROLAC TROMETHAMINE 15 MG/ML IJ SOLN
15.0000 mg | Freq: Once | INTRAMUSCULAR | Status: AC
  Administered 2023-10-19: 15 mg via INTRAVENOUS
  Filled 2023-10-19: qty 1

## 2023-10-19 MED ORDER — ONDANSETRON HCL 4 MG/2ML IJ SOLN
4.0000 mg | Freq: Once | INTRAMUSCULAR | Status: AC
  Administered 2023-10-19: 4 mg via INTRAVENOUS
  Filled 2023-10-19: qty 2

## 2023-10-19 MED ORDER — MORPHINE SULFATE (PF) 4 MG/ML IV SOLN
4.0000 mg | Freq: Once | INTRAVENOUS | Status: AC
  Administered 2023-10-19: 4 mg via INTRAVENOUS
  Filled 2023-10-19: qty 1

## 2023-10-19 NOTE — ED Notes (Signed)
 Pt aware of urine sample needed; unable to go at this time.

## 2023-10-19 NOTE — ED Provider Notes (Signed)
 [place] EMERGENCY DEPARTMENT AT [facility] Provider Note   CSN: [id number] Arrival date & time: 10/19/23  1754     History  Chief Complaint  Patient presents with   Abdominal Pain    Alexandria Carr is a 43 y.o. female with PMH as listed below who presents with left-sided abdominal pain that began last night.  Pain is constant but intermittently gets very severe.  Primarily located on the left side of her abdomen but today is centered around her belly button.  Patient has a history of 5 cesarean sections and has never had pain worse than this.  Denies being pregnant.  Denies vaginal bleeding, urinary symptoms including dysuria or hematuria.  No fever/chills.  Endorses nausea but no vomiting. Had a bowel movement today which didn't help her pain.    Past Medical History:  Diagnosis Date   Depression    h/o post partum depression    Headache    Postoperative anemia due to acute blood loss 02/20/2016   Postpartum care following repeat cesarean delivery (1/25) 02/20/2016   Umbilical hernia        Home Medications Prior to Admission medications   Medication Sig Start Date End Date Taking? Authorizing Provider  cholecalciferol (VITAMIN D) 1000 units tablet Take 1,000 Units by mouth daily.    [provider]  magnesium  oxide (MAG-OX) 400 MG tablet Take 400 mg by mouth every evening.    [provider]  Probiotic Product (PROBIOTIC PO) Take 1 capsule by mouth daily.    [provider]      Allergies    Food, Chlorhexidine, Codeine, Other, and Penicillins    Review of Systems   Review of Systems A 10 point review of systems was performed and is negative unless otherwise reported in HPI.  Physical Exam Updated Vital Signs BP (!) 146/110 (BP Location: Right Arm)   Pulse 84   Resp 16   Ht 5' 4 (1.626 m)   Wt 80.3 kg   LMP 06/21/2013   SpO2 97%   BMI 30.39 kg/m  Physical Exam General: Uncomfortable appearing female, standing by the  bed.  HEENT: Sclera anicteric, MMM, trachea midline.  Cardiology: RRR, no murmurs/rubs/gallops.  Resp: Normal respiratory rate and effort. CTAB, no wheezes, rhonchi, crackles.  Abd: Soft, +TTP in LLQ and periumbilical region, non-distended. No rebound tenderness or guarding.  GU: Deferred. MSK: No peripheral edema or signs of trauma. Extremities without deformity or TTP. No cyanosis or clubbing. Skin: warm, dry.  Back: No CVA tenderness Neuro: A&Ox4, CNs II-XII grossly intact. MAEs. Sensation grossly intact.  Psych: Normal mood and affect.   ED Results / Procedures / Treatments   Labs (all labs ordered are listed, but only abnormal results are displayed) Labs Reviewed  URINALYSIS, ROUTINE W REFLEX MICROSCOPIC - Abnormal; Notable for the following components:      Result Value   Color, Urine STRAW (*)    Hgb urine dipstick SMALL (*)    Bacteria, UA RARE (*)    All other components within normal limits  CBC WITH DIFFERENTIAL/PLATELET - Abnormal; Notable for the following components:   RBC 5.15 (*)    Hemoglobin 15.5 (*)    All other components within normal limits  LIPASE, BLOOD  COMPREHENSIVE METABOLIC PANEL WITH GFR  HCG, QUANTITATIVE, PREGNANCY    EKG None  Radiology CT Renal Stone Study Result Date: 10/19/2023 CLINICAL DATA:  Abdominal and flank pain with stone suspected. Severe abdominal pain beginning yesterday on the  left now radiating to the belly button. Previous history of kidney stones. EXAM: CT ABDOMEN AND PELVIS WITHOUT CONTRAST TECHNIQUE: Multidetector CT imaging of the abdomen and pelvis was performed following the standard protocol without IV contrast. RADIATION DOSE REDUCTION: This exam was performed according to the departmental dose-optimization program which includes automated exposure control, adjustment of the mA and/or kV according to patient size and/or use of iterative reconstruction technique. COMPARISON:  Renal ultrasound 12/31/2015 FINDINGS: Lower chest:  No acute abnormality. Hepatobiliary: No focal liver abnormality is seen. No gallstones, gallbladder wall thickening, or biliary dilatation. Pancreas: Unremarkable. No pancreatic ductal dilatation or surrounding inflammatory changes. Spleen: Normal in size without focal abnormality. Adrenals/Urinary Tract: No adrenal gland nodules. Kidneys are symmetrical. No renal, ureteral, or bladder stone. No hydronephrosis or hydroureter. Bladder is normal. Stomach/Bowel: Stomach is within normal limits. Appendix appears normal. No evidence of bowel wall thickening, distention, or inflammatory changes. Vascular/Lymphatic: No significant vascular findings are present. No enlarged abdominal or pelvic lymph nodes. Reproductive: Heterogeneous appearance of left adnexal mass with mildly increased density, possibly indicating hemorrhage. Lesion measures up to 3.8 x 6.5 cm. Right ovary is normal. Other: No free air or free fluid in the abdomen. Abdominal wall musculature appears intact. Musculoskeletal: No acute or significant osseous findings. IMPRESSION: 1. No renal or ureteral stone or obstruction. 2. Complex appearing lesion in the left adnexum measuring up to 6.5 cm diameter. Ultrasound is recommended for further characterization. Electronically Signed   By: Elsie Gravely M.D.   On: 10/19/2023 20:10    Procedures Procedures    Medications Ordered in ED Medications  ondansetron  (ZOFRAN ) injection 4 mg (4 mg Intravenous Given 10/19/23 1832)  morphine  (PF) 4 MG/ML injection 4 mg (4 mg Intravenous Given 10/19/23 1900)  ketorolac  (TORADOL ) 15 MG/ML injection 15 mg (15 mg Intravenous Given 10/19/23 1902)    ED Course/ Medical Decision Making/ A&P                          Medical Decision Making Amount and/or Complexity of Data Reviewed Labs: ordered. Decision-making details documented in ED Course. Radiology: ordered. Decision-making details documented in ED Course.  Risk Prescription drug management.    This  patient presents to the ED for concern of abdominal pain, this involves an extensive number of treatment options, and is a complaint that carries with it a high risk of complications and morbidity.  I considered the following differential and admission for this acute, potentially life threatening condition.   MDM:    For DDX for abdominal pain includes but is not limited to:  Abdominal exam without peritoneal signs. No evidence of acute abdomen at this time. Low suspicion for acute hepatobiliary disease (including acute cholecystitis or cholangitis), acute pancreatitis (neg lipase), PUD (including gastric perforation), acute infectious processes (pneumonia, hepatitis, pyelonephritis), acute appendicitis, vascular catastrophe, or ovarian torsion, diverticulitis.   Patient's CT renal stone did not demonstrate urolithiasis but rather an adnexal mass. Concerning for ovarian cyst rupture vs ovarian torsion. Pelvic US  r/o torsion was reassuringly negative for torsion but did demonstrate hemorrhagic ovarian cyst and some free fluid indicating prior rupture of a cyst as well. Patient states this has happened to her before many years ago. Advised f/u with OB/Gyn within 1-2 weeks. Offered oxycodone  for DC. Patient advised to alternate tylenol /ibuprofen  and to use oxycodone  for breakthrough pain. Also given zofran  for nausea/vomiting. Patient is overall well-apperinag and HDS and is stable for DC.    Clinical Course  as of 10/24/23 1623  Wed Oct 19, 2023  1930 Urinalysis, Routine w reflex microscopic -Urine, Clean Catch(!) No UTI [HN]  2046 CT Renal Stone Study 1. No renal or ureteral stone or obstruction. 2. Complex appearing lesion in the left adnexum measuring up to 6.5 cm diameter. Ultrasound is recommended for further characterization.   [HN]  2049 HCG machine was down for maintenance. Called lab who said it would be another 15-20 minutes in process. [HN]  2108 HCG, Beta Chain, Quant, S: <1 neg [HN]   Thu Oct 20, 2023  0023 US  PELVIC COMPLETE W TRANSVAGINAL AND TORSION R/O 1. 2.6 cm hemorrhagic left ovarian cyst. 2. Associated moderate hemorrhagic pelvic ascites, suggesting hemorrhagic ovarian cyst rupture. 3. No evidence of ovarian torsion.   [HN]    Clinical Course User Index [HN] Franklyn Sid SAILOR, MD    Labs: I Ordered, and personally interpreted labs.  The pertinent results include:  those listed above  Imaging Studies ordered: I ordered imaging studies including CT abdomen, pelvic US  r/o torsion I independently visualized and interpreted imaging. I agree with the radiologist interpretation  Additional history obtained from chart review.    Reevaluation: After the interventions noted above, I reevaluated the patient and found that they have :improved  Social Determinants of Health: Lives independently  Disposition:  DC w/ discharge instructions/return precautions. All questions answered to patient's satisfaction.    Co morbidities that complicate the patient evaluation  Past Medical History:  Diagnosis Date   Depression    h/o post partum depression    Headache    Postoperative anemia due to acute blood loss 02/20/2016   Postpartum care following repeat cesarean delivery (1/25) 02/20/2016   Umbilical hernia      Medicines Meds ordered this encounter  Medications   ondansetron  (ZOFRAN ) injection 4 mg   morphine  (PF) 4 MG/ML injection 4 mg    Refill:  0   ketorolac  (TORADOL ) 15 MG/ML injection 15 mg    I have reviewed the patients home medicines and have made adjustments as needed  Problem List / ED Course: Problem List Items Addressed This Visit   None Visit Diagnoses       Ovarian cyst rupture    -  Primary     Hemorrhagic ovarian cyst                       This note was created using dictation software, which may contain spelling or grammatical errors.    Franklyn Sid SAILOR, MD 10/24/23 1640

## 2023-10-19 NOTE — ED Triage Notes (Signed)
 Pt arrived via POV c/o severe abdominal pain that began yesterday morning on the left side and has now moved and radiated to below her belly button. Pt denies Hx of kidney stones. Pt reports pain worsened after having a bowel movement today and endorses nausea.

## 2023-10-20 DIAGNOSIS — Z90710 Acquired absence of both cervix and uterus: Secondary | ICD-10-CM | POA: Diagnosis not present

## 2023-10-20 DIAGNOSIS — N838 Other noninflammatory disorders of ovary, fallopian tube and broad ligament: Secondary | ICD-10-CM | POA: Diagnosis not present

## 2023-10-20 DIAGNOSIS — N83202 Unspecified ovarian cyst, left side: Secondary | ICD-10-CM | POA: Diagnosis not present

## 2023-10-20 DIAGNOSIS — R188 Other ascites: Secondary | ICD-10-CM | POA: Diagnosis not present

## 2023-10-20 MED ORDER — OXYCODONE HCL 5 MG PO TABS: 5.0000 mg | ORAL_TABLET | Freq: Four times a day (QID) | ORAL | 0 refills | Status: AC | PRN

## 2023-10-20 MED ORDER — ONDANSETRON 4 MG PO TBDP: 4.0000 mg | ORAL_TABLET | Freq: Three times a day (TID) | ORAL | 0 refills | Status: AC | PRN

## 2023-10-20 NOTE — Discharge Instructions (Addendum)
 Thank you for coming to [facility] Emergency Department. You were seen for abdominal pain. We did an exam, labs, and imaging, and these showed hemorrhagic ovarian cyst and likely cyst rupture causing her abdominal pain.  There were no signs of ovarian torsion.  We have prescribed Zofran  under the tongue 4 mg every 6 8 hours as needed for nausea and vomiting.  Please take Tylenol  1000 mg every 8 hours and ibuprofen  600 mg every 8 hours, you can alternate these.  For breakthrough pain we have also prescribed oxycodone  5 mg every 4-6 hours as needed.  Please do not drive while taking this medication as it can make you drowsy.  If you take this medication please take 1-2 capfuls of MiraLAX per day to help avoid constipation.  Please follow up with your OB/GYN within 1 week.   Do not hesitate to return to the ED or call 911 if you experience: -Worsening symptoms -Nausea vomiting so severe that you cannot eat or drink anything -Lightheadedness, passing out -Fevers/chills -Anything else that concerns you

## 2023-10-25 NOTE — Progress Notes (Signed)
 Location Information: Patient State (at time of visit): [place]  Patient Location (at time of visit):Home/Other Non-Medical  Provider Location: Home Is provider licensed to provide clinical care in the current location/state of the patient? Yes  Consent:  Patient's identity was confirmed. Presenting condition or illness was discussed with the patient/personal representative. Current proposed treatment for presenting condition or illness was explained to patient/personal representative along with the likely benefits and any significant risks or complications associated with the provision of treatment by audio/video means. The patient/personal representative verbally authorized treatment to be provided by audio/video, which may include a limited review of patient's current health status, medication, or other treatment recommendations, patient education, and an opportunity to ask questions about condition and treatment. Verbal Consent Granted by Patient/Personal Representative:Yes   Visit Information: Modality: 2-Way Real-Time Audio/Video  Video Total Time: 18 minutes   HPI:  Alexandria Carr is a 43 y.o. [id number] female, Patient's last menstrual period was 02/22/2023 (exact date)., who is here for ED follow-up visit after being seen d/t abdominal and pelvic pain who has ovarian cyst seen on ultrasound.   Today patient states pain is improved overall The pain that brought her to the ER was severe, worse than any pain she has experienced Since that time it has waxed and waned Thought it was improving Sunday/Monday Then woke up Monday night/Tuesday AM with sharp stabbing pain Pain again has improved yesterday afternoon and today Currently doing better at this moment  Imaging from ED noted concern for ovarian 6.5cm adnexal mass on left ovary However on pelvic ultrasound 2.6cm hemorrhagic cyst was seen at the left ovary  Patient seen in the ED 10/19/23 at [facility] Reported  left-sided ab pain began the night before, significant pain  +TTP in LLQ and periumbilical region  CT of ab/pelvis completed  Heterogeneous appearance of left adnexal mass with mildly increased density, possibly indicating hemorrhage. Lesion measures up to 3.8 x 6.5 cm. Right ovary is normal.  Concerning for ovarian cyst rupture vs ovarian torsion.  Pelvic US  r/o torsion was reassuringly negative for torsion but did demonstrate hemorrhagic ovarian cyst and some free fluid indicating prior rupture of a cyst as well  Advised f/u with OB/Gyn within 1-2 weeks   Saw Dr. Zander 05/24/23 for exam 2/2 use steroids in post-op window Saw me for post-op check 05/09/23 s/p TLH, cystoscopy on 03/25/2023 2/2 AUB   PMHx: Alexandria Carr has a past medical history of Allergy, Asthma (CMD), and Headache(784.0).  ObHx:  [id number]  CS x 5   GynHx:   -- Patient's last menstrual period was 02/22/2023 (exact date). -- Menarche age 10-12 -- Gyn Sx hx:  C/S x 5  BTL  TLH, cystoscopy on 03/25/2023 2/2 AUB -- She denies hx of abnormal pap -- Last pap 11/15/22 NILM  CT 10/19/23 Reproductive: Heterogeneous appearance of left adnexal mass with  mildly increased density, possibly indicating hemorrhage. Lesion  measures up to 3.8 x 6.5 cm. Right ovary is normal.   Pelvic US  10/20/23 US  Pelvis, Complete Transvaginal and Transabdominal without Doppler  TECHNIQUE:  Transabdominal and transvaginal pelvic duplex ultrasound using B-mode/gray  scaled imaging without Doppler spectral analysis and color flow was obtained.  COMPARISON:  CT abdomen / pelvis earlier today  CLINICAL HISTORY:  Pelvic pain [id number]; Adnexal mass [id number].  FINDINGS:  UTERUS:  Status post hysterectomy.  ENDOMETRIAL STRIPE:  Not applicable due to hysterectomy.  RIGHT OVARY:  Right ovary measures 2.9 x 1.7 x 2.3 cm (5.7 ml). Right ovary  is within normal  limits. There is normal arterial and venous Doppler flow.  LEFT OVARY:  Left ovary measures 4.9 x  3.6 x 3.9 cm (36 ml) with a 2.0 x 1.8 x 2.6 cm  hemorrhagic cyst and adjacent other ovarian cysts. There is normal arterial and  venous Doppler flow.  FREE FLUID:  Moderate pelvic ascites, complicated by hemorrhage.  IMPRESSION:  1. 2.6 cm hemorrhagic left ovarian cyst.  2. Associated moderate hemorrhagic pelvic ascites, suggesting hemorrhagic  ovarian cyst rupture.  3. No evidence of ovarian torsion.    Review of Systems:  Negative except as noted in HPI  LMP 02/22/2023 (Exact Date)      Objective:  Physical Exam Constitutional:      Appearance: Normal appearance.  Neurological:     Mental Status: She is alert.  Psychiatric:        Mood and Affect: Mood normal.        Behavior: Behavior normal.        Thought Content: Thought content normal.        Judgment: Judgment normal.         Assessment & Plan:  Alexandria Carr is a 43 y.o. [id number] female, Patient's last menstrual period was 02/22/2023 (exact date)., who presents for a virtual visit to follow-up from ED visit for severe abdominal/pelvic pain, with finding of 2.6cm hemorrhagic cyst on her left ovary, who notes pain waxed and waned since ED visit, not as severe as the initial pain, who today is feeling better overall  Pelvic pain/2.6cm hemorrhagic cyst -- Reviewed with patient to continue to monitor her pain  -- Patient denies concern for STI's -- Encouraged tylenol /ibuprofen  -- Plan for future repeat ultrasound to ensure resolution of cyst -- Consider f/u with PCP as well -- Order for follow-up pelvic ultrasound entered  Questions encouraged and answered. Pt verbalizes understanding and agrees with plan of care.    Orders Placed This Encounter  Procedures  . US  Pelvis Transabdominal With Eleonore   Katie Bozylinsky PA-C

## 2023-10-31 ENCOUNTER — Other Ambulatory Visit (HOSPITAL_COMMUNITY): Payer: Self-pay

## 2023-10-31 ENCOUNTER — Emergency Department (HOSPITAL_COMMUNITY)

## 2023-10-31 ENCOUNTER — Other Ambulatory Visit: Payer: Self-pay

## 2023-10-31 ENCOUNTER — Emergency Department (HOSPITAL_COMMUNITY)
Admission: EM | Admit: 2023-10-31 | Discharge: 2023-10-31 | Disposition: A | Discharge status: Home / Self Care | Attending: Emergency Medicine | Admitting: Emergency Medicine

## 2023-10-31 ENCOUNTER — Encounter (HOSPITAL_COMMUNITY): Payer: Self-pay | Admitting: Emergency Medicine

## 2023-10-31 DIAGNOSIS — K4020 Bilateral inguinal hernia, without obstruction or gangrene, not specified as recurrent: Secondary | ICD-10-CM | POA: Diagnosis not present

## 2023-10-31 DIAGNOSIS — K429 Umbilical hernia without obstruction or gangrene: Secondary | ICD-10-CM | POA: Diagnosis not present

## 2023-10-31 DIAGNOSIS — R162 Hepatomegaly with splenomegaly, not elsewhere classified: Secondary | ICD-10-CM | POA: Diagnosis not present

## 2023-10-31 DIAGNOSIS — R197 Diarrhea, unspecified: Secondary | ICD-10-CM | POA: Diagnosis not present

## 2023-10-31 DIAGNOSIS — K769 Liver disease, unspecified: Secondary | ICD-10-CM | POA: Diagnosis not present

## 2023-10-31 DIAGNOSIS — R112 Nausea with vomiting, unspecified: Secondary | ICD-10-CM | POA: Insufficient documentation

## 2023-10-31 DIAGNOSIS — R1013 Epigastric pain: Secondary | ICD-10-CM | POA: Insufficient documentation

## 2023-10-31 DIAGNOSIS — R109 Unspecified abdominal pain: Secondary | ICD-10-CM

## 2023-10-31 LAB — CBC WITH DIFFERENTIAL/PLATELET
Abs Immature Granulocytes: 0.04 K/uL (ref 0.00–0.07)
Basophils Absolute: 0.1 K/uL (ref 0.0–0.1)
Basophils Relative: 1 %
Eosinophils Absolute: 0 K/uL (ref 0.0–0.5)
Eosinophils Relative: 0 %
HCT: 45.7 % (ref 36.0–46.0)
Hemoglobin: 15.3 g/dL — ABNORMAL HIGH (ref 12.0–15.0)
Immature Granulocytes: 1 %
Lymphocytes Relative: 35 %
Lymphs Abs: 2.6 K/uL (ref 0.7–4.0)
MCH: 29.4 pg (ref 26.0–34.0)
MCHC: 33.5 g/dL (ref 30.0–36.0)
MCV: 87.9 fL (ref 80.0–100.0)
Monocytes Absolute: 0.6 K/uL (ref 0.1–1.0)
Monocytes Relative: 7 %
Neutro Abs: 4.2 K/uL (ref 1.7–7.7)
Neutrophils Relative %: 56 %
Platelets: 298 K/uL (ref 150–400)
RBC: 5.2 MIL/uL — ABNORMAL HIGH (ref 3.87–5.11)
RDW: 12.7 % (ref 11.5–15.5)
WBC: 7.5 K/uL (ref 4.0–10.5)
nRBC: 0 % (ref 0.0–0.2)

## 2023-10-31 LAB — URINALYSIS, ROUTINE W REFLEX MICROSCOPIC
Bilirubin Urine: NEGATIVE
Glucose, UA: NEGATIVE mg/dL
Hgb urine dipstick: NEGATIVE
Ketones, ur: NEGATIVE mg/dL
Leukocytes,Ua: NEGATIVE
Nitrite: NEGATIVE
Protein, ur: NEGATIVE mg/dL
Specific Gravity, Urine: 1.004 — ABNORMAL LOW (ref 1.005–1.030)
pH: 6 (ref 5.0–8.0)

## 2023-10-31 LAB — TROPONIN I (HIGH SENSITIVITY): Troponin I (High Sensitivity): 2 ng/L (ref <18)

## 2023-10-31 LAB — COMPREHENSIVE METABOLIC PANEL WITH GFR
ALT: 89 U/L — ABNORMAL HIGH (ref 0–44)
AST: 56 U/L — ABNORMAL HIGH (ref 15–41)
Albumin: 4 g/dL (ref 3.5–5.0)
Alkaline Phosphatase: 109 U/L (ref 38–126)
Anion gap: 9 (ref 5–15)
BUN: 9 mg/dL (ref 6–20)
CO2: 22 mmol/L (ref 22–32)
Calcium: 9 mg/dL (ref 8.9–10.3)
Chloride: 109 mmol/L (ref 98–111)
Creatinine, Ser: 0.76 mg/dL (ref 0.44–1.00)
GFR, Estimated: >60 mL/min (ref >60)
Glucose, Bld: 98 mg/dL (ref 70–99)
Potassium: 3.9 mmol/L (ref 3.5–5.1)
Sodium: 140 mmol/L (ref 135–145)
Total Bilirubin: 1 mg/dL (ref 0.0–1.2)
Total Protein: 7 g/dL (ref 6.5–8.1)

## 2023-10-31 LAB — HCG, SERUM, QUALITATIVE: Preg, Serum: NEGATIVE

## 2023-10-31 LAB — LIPASE, BLOOD: Lipase: 37 U/L (ref 11–51)

## 2023-10-31 LAB — PREGNANCY, URINE: Preg Test, Ur: NEGATIVE

## 2023-10-31 MED ORDER — MORPHINE SULFATE (PF) 4 MG/ML IV SOLN
4.0000 mg | Freq: Once | INTRAVENOUS | Status: AC
  Administered 2023-10-31: 4 mg via INTRAVENOUS
  Filled 2023-10-31: qty 1

## 2023-10-31 MED ORDER — SODIUM CHLORIDE 0.9 % IV BOLUS
1000.0000 mL | Freq: Once | INTRAVENOUS | Status: AC
  Administered 2023-10-31: 1000 mL via INTRAVENOUS

## 2023-10-31 MED ORDER — ONDANSETRON HCL 4 MG PO TABS
4.0000 mg | ORAL_TABLET | Freq: Four times a day (QID) | ORAL | 0 refills | Status: AC
  Filled 2023-10-31: qty 12, 3d supply, fill #0

## 2023-10-31 MED ORDER — ONDANSETRON HCL 4 MG/2ML IJ SOLN
4.0000 mg | Freq: Once | INTRAMUSCULAR | Status: AC
  Administered 2023-10-31: 4 mg via INTRAVENOUS
  Filled 2023-10-31: qty 2

## 2023-10-31 MED ORDER — ONDANSETRON 4 MG PO TBDP
8.0000 mg | ORAL_TABLET | Freq: Once | ORAL | Status: DC
  Filled 2023-10-31: qty 2

## 2023-10-31 MED ORDER — IOHEXOL 350 MG/ML SOLN
75.0000 mL | Freq: Once | INTRAVENOUS | Status: AC | PRN
  Administered 2023-10-31: 75 mL via INTRAVENOUS

## 2023-10-31 NOTE — ED Provider Notes (Signed)
 [place] [facility] AT [facility] Provider Note   CSN: [id number] Arrival date & time: 10/31/23  9085     Patient presents with: Abdominal Pain  HPI Alexandria Carr is a 43 y.o. female presenting for abdominal pain.  Has been going on for a couple of weeks but has been worse in the last 3 days.  Pain is in the epigastric region and radiates to her back.  She also has had nausea and vomiting and diarrhea.  She was at [facility] a week ago and was diagnosed with a hemorrhagic ovarian cyst she reported that the pain she had at that time was more in the left flank and left abdomen.  She denies chest pain and shortness of breath.  Denies excessive alcohol use.  Past Medical History:  Diagnosis Date   Depression    h/o post partum depression    Headache    History of partial hysterectomy 03/25/2023   Postoperative anemia due to acute blood loss 02/20/2016   Postpartum care following repeat cesarean delivery (1/25) 02/20/2016   Umbilical hernia        Abdominal Pain      Prior to Admission medications   Medication Sig Start Date End Date Taking? Authorizing Provider  levothyroxine (SYNTHROID) 50 MCG tablet Take 50 mcg by mouth daily. 09/13/23  Yes [provider]  liothyronine (CYTOMEL) 5 MCG tablet Take 10 mcg by mouth every morning.   Yes [provider]  magnesium  oxide (MAG-OX) 400 MG tablet Take 400 mg by mouth every evening.   Yes [provider]  ondansetron  (ZOFRAN ) 4 MG tablet Take 1 tablet (4 mg total) by mouth every 6 (six) hours. 10/31/23  Yes Lang Norleen POUR, PA-C  ondansetron  (ZOFRAN -ODT) 4 MG disintegrating tablet Take 1 tablet (4 mg total) by mouth every 8 (eight) hours as needed. Patient taking differently: Take 4 mg by mouth every 8 (eight) hours as needed for nausea, vomiting or refractory nausea / vomiting. 10/20/23  Yes Franklyn Sid SAILOR, MD  progesterone (PROMETRIUM) 100 MG capsule Take 200 mg by mouth at bedtime.    Yes [provider]  oxyCODONE  (ROXICODONE ) 5 MG immediate release tablet Take 1 tablet (5 mg total) by mouth every 6 (six) hours as needed for up to 12 doses for severe pain (pain score 7-10). Patient not taking: Reported on 10/31/2023 10/20/23   Franklyn Sid SAILOR, MD    Allergies: Milk (cow), Pumpkin flavoring agent (non-screening), Chlorhexidine, Codeine, Other, Oxycodone , and Penicillins    Review of Systems  Gastrointestinal:  Positive for abdominal pain.    Updated Vital Signs BP 112/80   Pulse (!) 56   Temp 98.3 F (36.8 C) (Oral)   Resp 13   Ht 5' 3 (1.6 m)   Wt 71.7 kg   LMP 06/21/2013   SpO2 100%   BMI 27.99 kg/m   Physical Exam Vitals and nursing note reviewed.  HENT:     Head: Normocephalic and atraumatic.     Mouth/Throat:     Mouth: Mucous membranes are moist.  Eyes:     General:        Right eye: No discharge.        Left eye: No discharge.     Conjunctiva/sclera: Conjunctivae normal.  Cardiovascular:     Rate and Rhythm: Normal rate and regular rhythm.     Pulses: Normal pulses.     Heart sounds: Normal heart sounds.  Pulmonary:     Effort:  Pulmonary effort is normal.     Breath sounds: Normal breath sounds.  Abdominal:     General: Abdomen is flat.     Palpations: Abdomen is soft.     Tenderness: There is abdominal tenderness in the epigastric area.  Skin:    General: Skin is warm and dry.  Neurological:     General: No focal deficit present.  Psychiatric:        Mood and Affect: Mood normal.     (all labs ordered are listed, but only abnormal results are displayed) Labs Reviewed  CBC WITH DIFFERENTIAL/PLATELET - Abnormal; Notable for the following components:      Result Value   RBC 5.20 (*)    Hemoglobin 15.3 (*)    All other components within normal limits  COMPREHENSIVE METABOLIC PANEL WITH GFR - Abnormal; Notable for the following components:   AST 56 (*)    ALT 89 (*)    All other components within normal limits   URINALYSIS, ROUTINE W REFLEX MICROSCOPIC - Abnormal; Notable for the following components:   Color, Urine STRAW (*)    Specific Gravity, Urine 1.004 (*)    All other components within normal limits  HCG, SERUM, QUALITATIVE  PREGNANCY, URINE  LIPASE, BLOOD  TROPONIN I (HIGH SENSITIVITY)    EKG: EKG Interpretation Date/Time:  Monday October 31 2023 09:39:09 EDT Ventricular Rate:  84 PR Interval:  128 QRS Duration:  91 QT Interval:  349 QTC Calculation: 413 R Axis:   66  Text Interpretation: Sinus rhythm Ventricular premature complex Borderline T wave abnormalities No old tracing to compare Confirmed by Freddi Hamilton [phone]) on 10/31/2023 9:58:55 AM  Radiology: CT ABDOMEN PELVIS W CONTRAST Result Date: 10/31/2023 CLINICAL DATA:  Abdominal pain EXAM: CT ABDOMEN AND PELVIS WITH CONTRAST TECHNIQUE: Multidetector CT imaging of the abdomen and pelvis was performed using the standard protocol following bolus administration of intravenous contrast. RADIATION DOSE REDUCTION: This exam was performed according to the departmental dose-optimization program which includes automated exposure control, adjustment of the mA and/or kV according to patient size and/or use of iterative reconstruction technique. CONTRAST:  75mL OMNIPAQUE IOHEXOL 350 MG/ML SOLN COMPARISON:  CT renal stone protocol October 19, 2023, pelvic ultrasound FINDINGS: Lower chest: No acute abnormality. Hepatobiliary: Subcentimeter right hepatic lobe lesion too small to characterize likely a cyst. Hepatomegaly measuring 18.1 cm in craniocaudal dimension. No gallstones, gallbladder wall thickening, or biliary dilatation. Pancreas: Unremarkable. No pancreatic ductal dilatation or surrounding inflammatory changes. Spleen: Splenomegaly measuring 13.6 cm. Adrenals/Urinary Tract: Adrenal glands are unremarkable. Kidneys are normal, without renal calculi, focal lesion, or hydronephrosis. Bladder is unremarkable. Stomach/Bowel: Stomach is within  normal limits. Appendix appears normal. No evidence of bowel wall thickening, distention, or inflammatory changes. Vascular/Lymphatic: No significant vascular findings are present. No ascites or loculated fluid. Reproductive: Hysterectomy. There is a septated tubular structure measuring 3.7 x 1.9 cm in left adnexa along the medial aspect of the left ovary which may represent a septated ovarian cyst (in the presence of tubal resection). There was a 2.6 cm hemorrhagic cyst on left side on prior ultrasound. Right ovary is unremarkable. Hypodense structure measuring 1.7 by 3.4 cm along the internal iliac artery may represent a prominent lymph node versus a lymphocele (6/51). This finding is in continuation with the former tubular structure and grossly similar to prior. Other: Bilateral fat containing small inguinal hernias. Small fat containing umbilical hernia with defect measuring 4 mm. Musculoskeletal: No acute or significant osseous findings. IMPRESSION: Tubular left adnexal structure  along the medial contour of the left ovary extending to left internal iliac artery. Findings may represent exophytic ovarian or paraovarian/adnexal cyst, versus residual hydrosalpinx. Recommend correlation with surgical history and follow-up to ensure stability/resolution. Hepatosplenomegaly. Electronically Signed   By: Megan  Zare M.D.   On: 10/31/2023 14:32   DG Chest Port 1 View Result Date: 10/31/2023 CLINICAL DATA:  epigastric pain EXAM: PORTABLE CHEST - 1 VIEW COMPARISON:  12/04/2015 FINDINGS: No focal airspace consolidation, pleural effusion, or pneumothorax. No cardiomegaly.No acute fracture or destructive lesion. IMPRESSION: No acute cardiopulmonary abnormality. Electronically Signed   By: Rogelia Myers M.D.   On: 10/31/2023 10:46     Procedures   Medications Ordered in the ED  sodium chloride  0.9 % bolus 1,000 mL (0 mLs Intravenous Stopped 10/31/23 1149)  ondansetron  (ZOFRAN ) injection 4 mg (4 mg Intravenous Given  10/31/23 1008)  morphine  (PF) 4 MG/ML injection 4 mg (4 mg Intravenous Given 10/31/23 1008)  iohexol (OMNIPAQUE) 350 MG/ML injection 75 mL (75 mLs Intravenous Contrast Given 10/31/23 1328)                                    Medical Decision Making Amount and/or Complexity of Data Reviewed Labs: ordered. Radiology: ordered.  Risk Prescription drug management.   Initial Impression and Ddx 43 year old well-appearing female presenting for abdominal pain.  Exam notable for epigastric tenderness.  DDx includes acute pancreatitis, gallbladder pathology, kidney stone, PE, dissection, ACS, other. Patient PMH that increases complexity of ED encounter: Recent ruptured ovarian cyst  Interpretation of Diagnostics - I independent reviewed and interpreted the labs as followed: slight ast/alt elevation  - I independently visualized the following imaging with scope of interpretation limited to determining acute life threatening conditions related to emergency care: CT ab/pelv, which revealed Tubular left adnexal structure along the medial contour of the left ovary extending to left internal iliac artery. Findings may represent exophytic ovarian or paraovarian/adnexal cyst, versus residual hydrosalpinx.  Patient Reassessment and Ultimate Disposition/Management On reassessment she states she was feeling much better pain.  Discussed CT findings with Dr. Henry OB/GYN who recommended coming provider.  Saw that she was seen 4 days ago at [facility].  He recommended that she schedule appointment there.  She has remained hemodynamically stable throughout encounter. Was somewhat nauseous on reassessment. Gave PO zofran  and sent some to her pharmacy. Discussed return precautions. Discharged.   Patient management required discussion with the following services or consulting groups:  OB/GYN  Complexity of Problems Addressed Acute complicated illness or Injury  Additional Data Reviewed and Analyzed Further  history obtained from: Past medical history and medications listed in the EMR and Prior ED visit notes  Patient Encounter Risk Assessment Consideration of hospitalization      Final diagnoses:  Abdominal pain, unspecified abdominal location    ED Discharge Orders          Ordered    ondansetron  (ZOFRAN ) 4 MG tablet  Every 6 hours        10/31/23 1527               Lang Norleen POUR, PA-C 10/31/23 1527    Freddi Hamilton, MD 11/03/23 [phone]

## 2023-10-31 NOTE — Discharge Instructions (Signed)
 Evaluation for your abdominal pain was reassuring. Recommend follow up with your OB/Gyn provider. If you have worsening abdominal pain, develop a fever, persistent nausea and vomiting or any other concern, please return to the ED for further evaluation.

## 2023-10-31 NOTE — ED Triage Notes (Signed)
 Pt POV from home due to two weeks of abdominal pain that has gotten worse over the past 3 days.  Pt reports she went to [facility] a week ago and they told her it was a hemorrhagic ovarian cyst ruptured.  Pt over the last three days states it has gotten worse, now has nausea/vomiting and diarrhea.  Pt reports the pain went from left lower and upper quadrant to mid sternum.

## 2023-11-03 ENCOUNTER — Other Ambulatory Visit (HOSPITAL_COMMUNITY): Payer: Self-pay

## 2023-11-07 DIAGNOSIS — R197 Diarrhea, unspecified: Secondary | ICD-10-CM | POA: Diagnosis not present

## 2023-11-07 DIAGNOSIS — R112 Nausea with vomiting, unspecified: Secondary | ICD-10-CM | POA: Diagnosis not present

## 2023-11-07 DIAGNOSIS — R1013 Epigastric pain: Secondary | ICD-10-CM | POA: Diagnosis not present

## 2023-11-10 DIAGNOSIS — R1011 Right upper quadrant pain: Secondary | ICD-10-CM | POA: Diagnosis not present

## 2023-11-10 DIAGNOSIS — R110 Nausea: Secondary | ICD-10-CM | POA: Diagnosis not present

## 2023-11-10 DIAGNOSIS — K317 Polyp of stomach and duodenum: Secondary | ICD-10-CM | POA: Diagnosis not present

## 2023-11-10 DIAGNOSIS — K208 Other esophagitis without bleeding: Secondary | ICD-10-CM | POA: Diagnosis not present

## 2023-11-10 DIAGNOSIS — K828 Other specified diseases of gallbladder: Secondary | ICD-10-CM | POA: Diagnosis not present

## 2023-11-10 DIAGNOSIS — K449 Diaphragmatic hernia without obstruction or gangrene: Secondary | ICD-10-CM | POA: Diagnosis not present

## 2023-11-10 DIAGNOSIS — K8020 Calculus of gallbladder without cholecystitis without obstruction: Secondary | ICD-10-CM | POA: Diagnosis not present

## 2023-11-14 DIAGNOSIS — R1013 Epigastric pain: Secondary | ICD-10-CM | POA: Diagnosis not present

## 2023-11-14 DIAGNOSIS — R112 Nausea with vomiting, unspecified: Secondary | ICD-10-CM | POA: Diagnosis not present

## 2023-11-14 DIAGNOSIS — K8020 Calculus of gallbladder without cholecystitis without obstruction: Secondary | ICD-10-CM | POA: Diagnosis not present

## 2023-11-22 DIAGNOSIS — K828 Other specified diseases of gallbladder: Secondary | ICD-10-CM | POA: Diagnosis not present

## 2023-11-22 DIAGNOSIS — R1013 Epigastric pain: Secondary | ICD-10-CM | POA: Diagnosis not present

## 2023-11-22 DIAGNOSIS — Z87891 Personal history of nicotine dependence: Secondary | ICD-10-CM | POA: Diagnosis not present

## 2023-11-22 DIAGNOSIS — K8020 Calculus of gallbladder without cholecystitis without obstruction: Secondary | ICD-10-CM | POA: Diagnosis not present
